# Patient Record
Sex: Female | Born: 1935 | Race: White | Hispanic: No | Smoking: Former smoker
Health system: Southern US, Community
[De-identification: ages and names within clinical notes are randomized; demographics above are authoritative.]

## PROBLEM LIST (undated history)

## (undated) DIAGNOSIS — G8929 Other chronic pain: Secondary | ICD-10-CM

## (undated) DIAGNOSIS — F419 Anxiety disorder, unspecified: Secondary | ICD-10-CM

## (undated) DIAGNOSIS — I7300 Raynaud's syndrome without gangrene: Secondary | ICD-10-CM

## (undated) DIAGNOSIS — F0390 Unspecified dementia without behavioral disturbance: Secondary | ICD-10-CM

## (undated) DIAGNOSIS — I10 Essential (primary) hypertension: Secondary | ICD-10-CM

## (undated) DIAGNOSIS — E039 Hypothyroidism, unspecified: Secondary | ICD-10-CM

## (undated) DIAGNOSIS — M79606 Pain in leg, unspecified: Secondary | ICD-10-CM

## (undated) HISTORY — PX: APPENDECTOMY: SHX54

## (undated) HISTORY — PX: ABDOMINAL HYSTERECTOMY: SHX81

## (undated) HISTORY — PX: OTHER SURGICAL HISTORY: SHX169

---

## 2004-12-27 ENCOUNTER — Ambulatory Visit: Payer: Self-pay | Admitting: Internal Medicine

## 2005-05-28 ENCOUNTER — Ambulatory Visit: Payer: Self-pay | Admitting: Internal Medicine

## 2005-12-13 ENCOUNTER — Ambulatory Visit: Payer: Self-pay | Admitting: Internal Medicine

## 2005-12-17 ENCOUNTER — Ambulatory Visit: Payer: Self-pay | Admitting: Internal Medicine

## 2006-01-02 ENCOUNTER — Ambulatory Visit: Payer: Self-pay | Admitting: Internal Medicine

## 2006-05-29 ENCOUNTER — Ambulatory Visit: Payer: Self-pay | Admitting: Unknown Physician Specialty

## 2006-08-28 ENCOUNTER — Ambulatory Visit: Payer: Self-pay | Admitting: Unknown Physician Specialty

## 2007-01-06 ENCOUNTER — Ambulatory Visit: Payer: Self-pay | Admitting: Internal Medicine

## 2007-01-09 ENCOUNTER — Ambulatory Visit: Payer: Self-pay | Admitting: Internal Medicine

## 2007-05-31 ENCOUNTER — Emergency Department: Payer: Self-pay | Admitting: Emergency Medicine

## 2008-01-07 ENCOUNTER — Ambulatory Visit: Payer: Self-pay | Admitting: Internal Medicine

## 2008-07-14 ENCOUNTER — Ambulatory Visit: Payer: Self-pay | Admitting: Ophthalmology

## 2008-07-25 ENCOUNTER — Ambulatory Visit: Payer: Self-pay | Admitting: Ophthalmology

## 2010-10-28 ENCOUNTER — Inpatient Hospital Stay: Payer: Self-pay | Admitting: Internal Medicine

## 2014-02-25 ENCOUNTER — Inpatient Hospital Stay: Payer: Self-pay | Admitting: Internal Medicine

## 2014-02-25 LAB — COMPREHENSIVE METABOLIC PANEL
Albumin: 3.3 g/dL — ABNORMAL LOW (ref 3.4–5.0)
Alkaline Phosphatase: 53 U/L
Anion Gap: 18 — ABNORMAL HIGH (ref 7–16)
BUN: 58 mg/dL — AB (ref 7–18)
Bilirubin,Total: 0.6 mg/dL (ref 0.2–1.0)
Calcium, Total: 7.7 mg/dL — ABNORMAL LOW (ref 8.5–10.1)
Chloride: 81 mmol/L — ABNORMAL LOW (ref 98–107)
Co2: 18 mmol/L — ABNORMAL LOW (ref 21–32)
Creatinine: 8.33 mg/dL — ABNORMAL HIGH (ref 0.60–1.30)
EGFR (African American): 6 — ABNORMAL LOW
EGFR (Non-African Amer.): 5 — ABNORMAL LOW
GLUCOSE: 121 mg/dL — AB (ref 65–99)
Osmolality: 254 (ref 275–301)
Potassium: 3.8 mmol/L (ref 3.5–5.1)
SGOT(AST): 28 U/L (ref 15–37)
SGPT (ALT): 22 U/L
Sodium: 117 mmol/L — CL (ref 136–145)
TOTAL PROTEIN: 6.8 g/dL (ref 6.4–8.2)

## 2014-02-25 LAB — URINALYSIS, COMPLETE
Bacteria: NONE SEEN
Bilirubin,UR: NEGATIVE
GLUCOSE, UR: NEGATIVE mg/dL (ref 0–75)
KETONE: NEGATIVE
Leukocyte Esterase: NEGATIVE
NITRITE: NEGATIVE
PH: 6 (ref 4.5–8.0)
Protein: NEGATIVE
Specific Gravity: 1.003 (ref 1.003–1.030)
Squamous Epithelial: NONE SEEN
WBC UR: 2 /HPF (ref 0–5)

## 2014-02-25 LAB — CBC WITH DIFFERENTIAL/PLATELET
Basophil #: 0 10*3/uL (ref 0.0–0.1)
Basophil %: 0.2 %
Eosinophil #: 0 10*3/uL (ref 0.0–0.7)
Eosinophil %: 0.1 %
HCT: 33.8 % — ABNORMAL LOW (ref 35.0–47.0)
HGB: 11.9 g/dL — ABNORMAL LOW (ref 12.0–16.0)
Lymphocyte #: 0.9 10*3/uL — ABNORMAL LOW (ref 1.0–3.6)
Lymphocyte %: 12.8 %
MCH: 32.1 pg (ref 26.0–34.0)
MCHC: 35.3 g/dL (ref 32.0–36.0)
MCV: 91 fL (ref 80–100)
Monocyte #: 0.7 x10 3/mm (ref 0.2–0.9)
Monocyte %: 9.4 %
Neutrophil #: 5.7 10*3/uL (ref 1.4–6.5)
Neutrophil %: 77.5 %
PLATELETS: 224 10*3/uL (ref 150–440)
RBC: 3.7 10*6/uL — ABNORMAL LOW (ref 3.80–5.20)
RDW: 11.9 % (ref 11.5–14.5)
WBC: 7.4 10*3/uL (ref 3.6–11.0)

## 2014-02-25 LAB — LIPASE, BLOOD: LIPASE: 712 U/L — AB (ref 73–393)

## 2014-02-25 LAB — CLOSTRIDIUM DIFFICILE(ARMC)

## 2014-02-25 LAB — TROPONIN I: Troponin-I: <0.02 ng/mL

## 2014-02-26 LAB — BASIC METABOLIC PANEL
Anion Gap: 16 (ref 7–16)
BUN: 49 mg/dL — ABNORMAL HIGH (ref 7–18)
CALCIUM: 7.2 mg/dL — AB (ref 8.5–10.1)
Chloride: 87 mmol/L — ABNORMAL LOW (ref 98–107)
Co2: 18 mmol/L — ABNORMAL LOW (ref 21–32)
Creatinine: 8.1 mg/dL — ABNORMAL HIGH (ref 0.60–1.30)
EGFR (Non-African Amer.): 5 — ABNORMAL LOW
GFR CALC AF AMER: 6 — AB
GLUCOSE: 79 mg/dL (ref 65–99)
OSMOLALITY: 256 (ref 275–301)
Potassium: 3.3 mmol/L — ABNORMAL LOW (ref 3.5–5.1)
SODIUM: 121 mmol/L — AB (ref 136–145)

## 2014-02-26 LAB — CBC WITH DIFFERENTIAL/PLATELET
BASOS ABS: 0 10*3/uL (ref 0.0–0.1)
Basophil %: 0.1 %
EOS PCT: 0.1 %
Eosinophil #: 0 10*3/uL (ref 0.0–0.7)
HCT: 30.8 % — ABNORMAL LOW (ref 35.0–47.0)
HGB: 11 g/dL — AB (ref 12.0–16.0)
LYMPHS PCT: 9.7 %
Lymphocyte #: 0.8 10*3/uL — ABNORMAL LOW (ref 1.0–3.6)
MCH: 32.8 pg (ref 26.0–34.0)
MCHC: 35.7 g/dL (ref 32.0–36.0)
MCV: 92 fL (ref 80–100)
Monocyte #: 1 x10 3/mm — ABNORMAL HIGH (ref 0.2–0.9)
Monocyte %: 12.1 %
NEUTROS ABS: 6.3 10*3/uL (ref 1.4–6.5)
Neutrophil %: 78 %
Platelet: 200 10*3/uL (ref 150–440)
RBC: 3.35 10*6/uL — AB (ref 3.80–5.20)
RDW: 12.1 % (ref 11.5–14.5)
WBC: 8.1 10*3/uL (ref 3.6–11.0)

## 2014-02-26 LAB — PROTEIN / CREATININE RATIO, URINE
CREATININE, URINE: 27.5 mg/dL — AB (ref 30.0–125.0)
PROTEIN, RANDOM URINE: 7 mg/dL (ref 0–12)
PROTEIN/CREAT. RATIO: 255 mg/g{creat} — AB (ref 0–200)

## 2014-02-27 LAB — CBC WITH DIFFERENTIAL/PLATELET
BASOS ABS: 0 10*3/uL (ref 0.0–0.1)
Basophil %: 0.2 %
EOS PCT: 0.7 %
Eosinophil #: 0 10*3/uL (ref 0.0–0.7)
HCT: 31.9 % — AB (ref 35.0–47.0)
HGB: 11.3 g/dL — ABNORMAL LOW (ref 12.0–16.0)
Lymphocyte #: 0.8 10*3/uL — ABNORMAL LOW (ref 1.0–3.6)
Lymphocyte %: 13.9 %
MCH: 32.4 pg (ref 26.0–34.0)
MCHC: 35.4 g/dL (ref 32.0–36.0)
MCV: 92 fL (ref 80–100)
Monocyte #: 1 x10 3/mm — ABNORMAL HIGH (ref 0.2–0.9)
Monocyte %: 16.5 %
Neutrophil #: 4.1 10*3/uL (ref 1.4–6.5)
Neutrophil %: 68.7 %
Platelet: 203 10*3/uL (ref 150–440)
RBC: 3.49 10*6/uL — AB (ref 3.80–5.20)
RDW: 12.4 % (ref 11.5–14.5)
WBC: 6 10*3/uL (ref 3.6–11.0)

## 2014-02-27 LAB — COMPREHENSIVE METABOLIC PANEL
Albumin: 2.8 g/dL — ABNORMAL LOW (ref 3.4–5.0)
Alkaline Phosphatase: 47 U/L
Anion Gap: 16 (ref 7–16)
BILIRUBIN TOTAL: 0.4 mg/dL (ref 0.2–1.0)
BUN: 49 mg/dL — ABNORMAL HIGH (ref 7–18)
CREATININE: 7.69 mg/dL — AB (ref 0.60–1.30)
Calcium, Total: 7.1 mg/dL — ABNORMAL LOW (ref 8.5–10.1)
Chloride: 94 mmol/L — ABNORMAL LOW (ref 98–107)
Co2: 16 mmol/L — ABNORMAL LOW (ref 21–32)
EGFR (African American): 6 — ABNORMAL LOW
GFR CALC NON AF AMER: 5 — AB
GLUCOSE: 86 mg/dL (ref 65–99)
Osmolality: 266 (ref 275–301)
Potassium: 3.3 mmol/L — ABNORMAL LOW (ref 3.5–5.1)
SGOT(AST): 26 U/L (ref 15–37)
SGPT (ALT): 21 U/L
Sodium: 126 mmol/L — ABNORMAL LOW (ref 136–145)
Total Protein: 6 g/dL — ABNORMAL LOW (ref 6.4–8.2)

## 2014-02-28 LAB — COMPREHENSIVE METABOLIC PANEL
ALBUMIN: 2.7 g/dL — AB (ref 3.4–5.0)
Alkaline Phosphatase: 51 U/L
Anion Gap: 15 (ref 7–16)
BUN: 48 mg/dL — ABNORMAL HIGH (ref 7–18)
Bilirubin,Total: 0.3 mg/dL (ref 0.2–1.0)
CO2: 24 mmol/L (ref 21–32)
CREATININE: 7.08 mg/dL — AB (ref 0.60–1.30)
Calcium, Total: 7.1 mg/dL — ABNORMAL LOW (ref 8.5–10.1)
Chloride: 90 mmol/L — ABNORMAL LOW (ref 98–107)
EGFR (African American): 7 — ABNORMAL LOW
EGFR (Non-African Amer.): 6 — ABNORMAL LOW
GLUCOSE: 113 mg/dL — AB (ref 65–99)
Osmolality: 272 (ref 275–301)
Potassium: 2.9 mmol/L — ABNORMAL LOW (ref 3.5–5.1)
SGOT(AST): 24 U/L (ref 15–37)
SGPT (ALT): 21 U/L
Sodium: 129 mmol/L — ABNORMAL LOW (ref 136–145)
TOTAL PROTEIN: 5.9 g/dL — AB (ref 6.4–8.2)

## 2014-02-28 LAB — KAPPA/LAMBDA FREE LIGHT CHAINS (ARMC)

## 2014-02-28 LAB — PROTEIN ELECTROPHORESIS(ARMC)

## 2014-02-28 LAB — LIPASE, BLOOD: Lipase: 387 U/L (ref 73–393)

## 2014-03-01 LAB — CBC WITH DIFFERENTIAL/PLATELET
BASOS ABS: 0 10*3/uL (ref 0.0–0.1)
BASOS PCT: 0.4 %
EOS ABS: 0.1 10*3/uL (ref 0.0–0.7)
EOS PCT: 1.3 %
HCT: 29.4 % — AB (ref 35.0–47.0)
HGB: 10.3 g/dL — AB (ref 12.0–16.0)
Lymphocyte #: 1.1 10*3/uL (ref 1.0–3.6)
Lymphocyte %: 20.4 %
MCH: 32.4 pg (ref 26.0–34.0)
MCHC: 35.2 g/dL (ref 32.0–36.0)
MCV: 92 fL (ref 80–100)
Monocyte #: 1.1 x10 3/mm — ABNORMAL HIGH (ref 0.2–0.9)
Monocyte %: 19.6 %
NEUTROS ABS: 3.2 10*3/uL (ref 1.4–6.5)
Neutrophil %: 58.3 %
Platelet: 224 10*3/uL (ref 150–440)
RBC: 3.19 10*6/uL — AB (ref 3.80–5.20)
RDW: 12.2 % (ref 11.5–14.5)
WBC: 5.4 10*3/uL (ref 3.6–11.0)

## 2014-03-01 LAB — COMPREHENSIVE METABOLIC PANEL
ANION GAP: 10 (ref 7–16)
AST: 30 U/L (ref 15–37)
Albumin: 2.8 g/dL — ABNORMAL LOW (ref 3.4–5.0)
Alkaline Phosphatase: 51 U/L
BUN: 44 mg/dL — ABNORMAL HIGH (ref 7–18)
Bilirubin,Total: 0.3 mg/dL (ref 0.2–1.0)
CALCIUM: 7.3 mg/dL — AB (ref 8.5–10.1)
Chloride: 93 mmol/L — ABNORMAL LOW (ref 98–107)
Co2: 31 mmol/L (ref 21–32)
Creatinine: 6.52 mg/dL — ABNORMAL HIGH (ref 0.60–1.30)
EGFR (Non-African Amer.): 6 — ABNORMAL LOW
GFR CALC AF AMER: 8 — AB
GLUCOSE: 109 mg/dL — AB (ref 65–99)
Osmolality: 280 (ref 275–301)
POTASSIUM: 3.7 mmol/L (ref 3.5–5.1)
SGPT (ALT): 25 U/L
Sodium: 134 mmol/L — ABNORMAL LOW (ref 136–145)
TOTAL PROTEIN: 6 g/dL — AB (ref 6.4–8.2)

## 2014-03-01 LAB — STOOL CULTURE

## 2014-03-01 LAB — MAGNESIUM: Magnesium: 1.2 mg/dL — ABNORMAL LOW

## 2014-03-02 LAB — BASIC METABOLIC PANEL
Anion Gap: 9 (ref 7–16)
BUN: 43 mg/dL — ABNORMAL HIGH (ref 7–18)
CALCIUM: 7.1 mg/dL — AB (ref 8.5–10.1)
CREATININE: 5.44 mg/dL — AB (ref 0.60–1.30)
Chloride: 98 mmol/L (ref 98–107)
Co2: 28 mmol/L (ref 21–32)
EGFR (African American): 10 — ABNORMAL LOW
EGFR (Non-African Amer.): 8 — ABNORMAL LOW
GLUCOSE: 92 mg/dL (ref 65–99)
Osmolality: 281 (ref 275–301)
Potassium: 4.1 mmol/L (ref 3.5–5.1)
Sodium: 135 mmol/L — ABNORMAL LOW (ref 136–145)

## 2014-03-02 LAB — CBC WITH DIFFERENTIAL/PLATELET
BASOS ABS: 0 10*3/uL (ref 0.0–0.1)
BASOS PCT: 0.4 %
Eosinophil #: 0.1 10*3/uL (ref 0.0–0.7)
Eosinophil %: 1.7 %
HCT: 27.4 % — ABNORMAL LOW (ref 35.0–47.0)
HGB: 9.3 g/dL — AB (ref 12.0–16.0)
Lymphocyte #: 1.1 10*3/uL (ref 1.0–3.6)
Lymphocyte %: 19.8 %
MCH: 32 pg (ref 26.0–34.0)
MCHC: 34.1 g/dL (ref 32.0–36.0)
MCV: 94 fL (ref 80–100)
MONOS PCT: 17.1 %
Monocyte #: 0.9 x10 3/mm (ref 0.2–0.9)
NEUTROS ABS: 3.3 10*3/uL (ref 1.4–6.5)
Neutrophil %: 61 %
PLATELETS: 209 10*3/uL (ref 150–440)
RBC: 2.92 10*6/uL — AB (ref 3.80–5.20)
RDW: 12.6 % (ref 11.5–14.5)
WBC: 5.3 10*3/uL (ref 3.6–11.0)

## 2014-03-02 LAB — UR PROT ELECTROPHORESIS, URINE RANDOM

## 2014-03-03 LAB — CULTURE, BLOOD (SINGLE)

## 2014-03-03 LAB — STOOL CULTURE

## 2014-03-04 ENCOUNTER — Ambulatory Visit: Payer: Self-pay | Admitting: Nephrology

## 2014-03-04 LAB — RENAL FUNCTION PANEL
ANION GAP: 10 (ref 7–16)
Albumin: 3.6 g/dL (ref 3.4–5.0)
BUN: 42 mg/dL — AB (ref 7–18)
CHLORIDE: 94 mmol/L — AB (ref 98–107)
CREATININE: 4 mg/dL — AB (ref 0.60–1.30)
Calcium, Total: 8.6 mg/dL (ref 8.5–10.1)
Co2: 27 mmol/L (ref 21–32)
EGFR (African American): 14 — ABNORMAL LOW
EGFR (Non-African Amer.): 11 — ABNORMAL LOW
Glucose: 132 mg/dL — ABNORMAL HIGH (ref 65–99)
Osmolality: 275 (ref 275–301)
POTASSIUM: 4.3 mmol/L (ref 3.5–5.1)
Phosphorus: 4.8 mg/dL (ref 2.5–4.9)
Sodium: 131 mmol/L — ABNORMAL LOW (ref 136–145)

## 2014-03-07 ENCOUNTER — Ambulatory Visit: Payer: Self-pay | Admitting: Nephrology

## 2014-03-07 LAB — RENAL FUNCTION PANEL
Albumin: 3.7 g/dL (ref 3.4–5.0)
Anion Gap: 7 (ref 7–16)
BUN: 44 mg/dL — ABNORMAL HIGH (ref 7–18)
CHLORIDE: 96 mmol/L — AB (ref 98–107)
CO2: 29 mmol/L (ref 21–32)
CREATININE: 2.78 mg/dL — AB (ref 0.60–1.30)
Calcium, Total: 8.8 mg/dL (ref 8.5–10.1)
EGFR (African American): 21 — ABNORMAL LOW
EGFR (Non-African Amer.): 17 — ABNORMAL LOW
Glucose: 151 mg/dL — ABNORMAL HIGH (ref 65–99)
OSMOLALITY: 279 (ref 275–301)
POTASSIUM: 3.3 mmol/L — AB (ref 3.5–5.1)
Phosphorus: 3.8 mg/dL (ref 2.5–4.9)
Sodium: 132 mmol/L — ABNORMAL LOW (ref 136–145)

## 2014-08-13 NOTE — Consult Note (Signed)
Pt seen and examined. Full consult to follow. At least 3 week hx of acute diarrhea, perhaps starting day after eating fast foods. Admitted with dehydration, acute renal failure, and metabolic acidosis despite trying antibiotics at home. Overall slightly better though still feels weak. Stool watery yesterday. Stool starting to form today. Stool studies are negative. Pt had colonoscopy attempted in 2008. Had medium sized polyp removed. Had significant sigmoid diverticular disease. Unfortunatley, scope could not reach beyond mid sigmoid colon. Barium enema was recommended to evatluate rest of colon then but never scheduled according to patient. Normallly, with hx of colon polyp, persistent diarrhea, and incomplete colonoscopy in the past, I would normally recommend repeating colonoscopy. However, with her prior hx, colonscopy may not be feasible at this time. Barium enema later could be considered, but patient will still have to drink bowel prep, which patient is unwilling to do. Even if BE shows something abnormal, then dilemma is what to do about abnormal finding. At this time, recommend continuing IV hydration and, supportive care, and Abx coverage. Has been on clears. Will advance to full liquid diet. Will follow. Thanks   Electronic Signatures: Lutricia Feilh, Jamil Armwood (MD) (Signed on 08-Nov-15 10:44)  Authored   Last Updated: 08-Nov-15 10:45 by Lutricia Feilh, Deborh Pense (MD)

## 2014-08-13 NOTE — Consult Note (Signed)
PATIENT NAME:  Mercedes Powell, Mercedes Powell MR#:  [id number] DATE OF BIRTH:  1928/12/25  DATE OF CONSULTATION:  02/27/2014  REFERRING PHYSICIAN:   CONSULTING PHYSICIAN:  Ezzard Standing. Zeus Marquis, MD  REASON FOR REFERRAL: Diarrhea   DESCRIPTION: The patient is an 81 year old white female who was admitted with at least 3 weeks of diarrhea that started after eating some fast food. Initially, she was treated for possible diverticulitis as an outpatient with Cipro and Flagyl for about a week. Unfortunately, her symptoms got worse with diarrhea and generalized weakness associated with nausea, vomiting, and decreased appetite, and it was to the point that she could not even control bowel movements. Therefore, she was admitted for further evaluation. The patient was found to be in acute renal failure with metabolic acidosis and significant dehydration.   Overall, in the last couple of days, she is starting to feel a little better. She still feels weak. Yesterday, she had watery stools. Today, the stools are starting to form.   PAST MEDICAL HISTORY:  Notable for hypothyroidism, panic attacks, hyponatremia, peripheral vascular disease, history of breast nodules and hypertension.   PAST SURGICAL HISTORY INCLUDES: Hysterectomy, appendectomy, and cataract surgery.   ALLERGIES: SHE IS ALLERGIC TO CODEINE AND ACE INHIBITORS.   SOCIAL HISTORY: She lives by herself. She used to smoke for 60 years, but quit. There is no alcohol history.   FAMILY HISTORY: Notable for coronary artery disease, stroke and dementia.   HOME MEDICATIONS: Vitamins, levothyroxine daily, losartan 50 mg twice a day, metoprolol 50 mg twice a day, amlodipine 10 mg daily. She has already finished the Cipro and Flagyl for a week.   REVIEW OF SYSTEMS: She feels weak. There are no fevers or chills. There are no visual or hearing changes. No coughing or shortness of breath. There is no chest pain or palpitations. There are no skin lesions. There is some abdominal pain  associated with nausea, vomiting, and diarrhea, which has been present for at least 3 weeks.   PHYSICAL EXAMINATION:  GENERAL: The patient is an elderly female in no acute distress, but she does look weak.  VITAL SIGNS: Stable.  HEENT: Normocephalic, atraumatic head. Pupils were equally reactive. Throat was clear.  NECK: Supple.  CARDIAC: Regular rhythm and rate.  LUNGS: Clear bilaterally.  ABDOMEN: Normoactive bowel sounds. Soft. It as nontender today and nondistended. She has good active bowel sounds.  EXTREMITIES: No clubbing, cyanosis, or edema.  SKIN: Some decrease in skin turgor.  RADIOLOGICAL DATA: On admission her sodium was only 117; creatinine was 8.33, BUN 58. Liver enzymes were normal. Lipase was 712. White count normal at 7.4. Hemoglobin was 11.9. This morning, creatinine is slightly better at 7.69; sodium is better at 126, potassium 3.3. Liver enzymes are again normal. Hemoglobin is stable at 11.0. Stool tests have been negative.   The patient had colonoscopy attempted in 2008; at that time, a medium-sized polyp was removed. The patient has significant sigmoid diverticula. Dr. Mechele Collin, who had performed the colonoscopy, could not advance the scope beyond the mid sigmoid colon. Barium enema was recommended shortly after, but it was never scheduled according to the patient.   The patient has had at least a 3 week history of diarrhea. So far, the stool studies have been negative. Clinically, she is slowly improving. Normally, with a history of colon polyp and incomplete colonoscopy in the past, I would recommend doing a colonoscopy now. Unfortunately, it be may be very difficult to get the scope around the sigmoid again. The  patient is not going to drink the bowel prep to go for the colonoscopy at this time. Barium enema later could be considered, but the patient will still have to drink the bowel prep. Even if the barium enema shows something abnormal, then the dilemma is exactly what to  do about it with that abnormal finding. Therefore, at this time, I recommend continuing with IV hydration and supportive care. Hopefully, her condition will continue to improve. The patient has been on a clear liquid diet. I will try to advance the diet to a full liquid diet and see what happens with the diarrhea.   Thank you for the referral.    ____________________________ Ezzard StandingPaul Y. Bluford Kaufmannh, MD pyo:MT D: 02/28/2014 08:59:28 ET T: 02/28/2014 09:24:04 ET JOB#: [id number]  cc: Ezzard StandingPaul Y. Bluford Kaufmannh, MD, <Dictator> Ezzard StandingPAUL Y Quashon Jesus MD ELECTRONICALLY SIGNED 03/03/2014 10:05

## 2014-08-13 NOTE — Discharge Summary (Signed)
PATIENT NAME:  Mercedes Powell, Mercedes Powell MR#:  [id number] DATE OF BIRTH:  12-29-1928  DATE OF ADMISSION:  02/25/2014 DATE OF DISCHARGE:  03/02/2014  DISCHARGE DIAGNOSES:  1.  Acute renal failure due to acute tubular necrosis from hypotension.  2.  Hyponatremia.  3.  Hypokalemia.  4.  Memory disturbance.   DISCHARGE MEDICATIONS:  Ascorbic acid 500 mg daily, Vactiv calcium chews 2 b.i.d., vitamin D 2000 units daily, Synthroid 75 mcg daily, Super B complex daily, metoprolol tartrate 50 mg 0.5 tablet every morning, Aricept 5 mg at bedtime.   REASON FOR ADMISSION: An 81 year old female presents with acute renal failure and weakness with hyponatremia and hypokalemia. Please see H and P for history of present illness, past medical history and physical exam.   HOSPITAL COURSE: The patient was admitted. CT of the abdomen showed some mild inflammation, otherwise negative. All of her stool cultures were negative. She was given a sodium bicarbonate drip with IV fluid resuscitation and her renal function improved slowly. Her renal ultrasound was negative. Her creatinine started at 8 was down to 5.4 on discharge with baseline creatinine 1.5. Her blood pressure medicine was discontinued and only 1/2 tablet  metoprolol tartrate given in the morning to ward off any rebound tachycardia. She was somewhat unstable on her feet and received physical therapy and will be getting home health physical therapy. Diarrhea has resolved. There is no sign of infectious illness at this point. She will follow up with Dr. Hyacinth MeekerMiller in 1 week to re-evaluate her renal function and she is instructed to drink 2 liters of fluid daily.  Aricept was started for her memory as it has been declining of recent.    ____________________________ Danella PentonMark F. Miller, MD mfm:lt D: 03/02/2014 08:12:21 ET T: 03/02/2014 11:15:21 ET JOB#: [id number]  cc: Danella PentonMark F. Miller, MD, <Dictator> MARK Sherlene ShamsF MILLER MD ELECTRONICALLY SIGNED 03/03/2014 8:32

## 2014-08-13 NOTE — Consult Note (Signed)
Chief Complaint:  Subjective/Chief Complaint Diarrhea resolved. K low. Being repleted. Still feels weak.   VITAL SIGNS/ANCILLARY NOTES: **Vital Signs.:   09-Nov-15 04:30  Vital Signs Type Routine  Temperature Temperature (F) 98  Celsius 36.6  Temperature Source oral  Pulse Pulse 70  Respirations Respirations 20  Systolic BP Systolic BP 356  Diastolic BP (mmHg) Diastolic BP (mmHg) 69  Mean BP 86  Pulse Ox % Pulse Ox % 95  Pulse Ox Activity Level  At rest  Oxygen Delivery Room Air/ 21 %   Brief Assessment:  GEN no acute distress   Cardiac Regular   Respiratory clear BS   Gastrointestinal Normal   Lab Results: Hepatic:  09-Nov-15 06:12   Bilirubin, Total 0.3  Alkaline Phosphatase 51 (46-116 NOTE: New Reference Range 11/09/13)  SGPT (ALT) 21 (14-63 NOTE: New Reference Range 11/09/13)  SGOT (AST) 24  Total Protein, Serum  5.9  Albumin, Serum  2.7  Routine Chem:  09-Nov-15 06:12   Glucose, Serum  113  BUN  48  Creatinine (comp)  7.08  Sodium, Serum  129  Potassium, Serum  2.9  Chloride, Serum  90  CO2, Serum 24  Calcium (Total), Serum  7.1  Osmolality (calc) 272  eGFR (African American)  7  eGFR (Non-African American)  6 (eGFR values <18m/min/1.73 m2 may be an indication of chronic kidney disease (CKD). Calculated eGFR, using the MRDR Study equation, is useful in  patients with stable renal function. The eGFR calculation will not be reliable in acutely ill patients when serum creatinine is changing rapidly. It is not useful in patients on dialysis. The eGFR calculation may not be applicable to patients at the low and high extremes of body sizes, pregnant women, and vegetarians.)  Anion Gap 15  Lipase 387 (Result(s) reported on 28 Feb 2014 at 06:56AM.)   Assessment/Plan:  Assessment/Plan:  Assessment Diarrhea. Resolved. ARF and hypokalemia.   Plan Will sign off. Call [facility] if diarrehea recurs. See yesterday's notes. Thanks.   Electronic  Signatures: OVerdie Shire(MD)  (Signed [phone]:[id number])  Authored: Chief Complaint, VITAL SIGNS/ANCILLARY NOTES, Brief Assessment, Lab Results, Assessment/Plan   Last Updated: 09-Nov-15 13:12 by OVerdie Shire(MD)

## 2014-08-13 NOTE — H&P (Signed)
PATIENT NAME:  Mercedes Powell, Mercedes Powell MR#:  [id number] DATE OF BIRTH:  Sep 18, 1928  DATE OF ADMISSION:  02/25/2014  PRIMARY DOCTOR: Bethann PunchesMark Miller, MD     CHIEF COMPLAINT: Diarrhea for 3 weeks.   HISTORY OF PRESENT ILLNESS: An 81 year old female with diarrhea for 3 weeks, treated for diverticulitis as an outpatient with Cipro and Flagyl for a week. The patient still has diarrhea with generalized weakness, poor appetite, nausea and vomiting. The patient started to have diarrhea 3 weeks ago associated with abdominal cramps. Went to Dr. Marlynn PerkingMark Miller's office, was given Cipro and Flagyl since 10/26. The patient finished the antibiotics but still has diarrhea, which sometimes she is unable to control the BM, associated with some nausea and poor appetite. She says she was on a liquid diet for about 2 weeks. Denies any fever. No other complaints. Feels very weak.   PAST MEDICAL HISTORY: Significant for hypertension, hypothyroidism, anxiety, panic attacks, chronic leg pain, hyponatremia, peripheral vascular disease, history of breast nodules.   PAST SURGICAL HISTORY: Significant for hysterectomy, appendectomy and cataract operation.  ALLERGIES: CODEINE CAUSES HER TONGUE SWELLING, ACE INHIBITORS CAUSE ANGIOEDEMA.   SOCIAL HISTORY: Lives by herself. The patient smoked for 60 years and quit. No alcohol.   FAMILY HISTORY: Significant for coronary artery disease in the father. Mother had dementia and CVA.   MEDICATIONS: Ascorbic acid 500 mg p.o. daily, vitamin B complex 1 tablet p.o. daily, calcium with vitamin D 500/1000 units/40 mcg p.o. b.i.d., levothyroxine 75 mcg p.o. daily, losartan 50 mg p.o. b.i.d., Metoprolol tartrate 50 mg p.o. b.i.d. She finished Cipro and Flagyl. She is also on amlodipine 10 mg p.o. daily.   REVIEW OF SYSTEMS:  CONSTITUTIONAL: The patient feels very weak. Denies any fever or fatigue.  EYES: No blurred vision.  ENT: No tinnitus. No ear pain. No epistaxis. No difficulty swallowing.   RESPIRATION: No cough. No wheezing.  CARDIOVASCULAR: No chest pain. No orthopnea. No palpitations. No syncope.  GENITOURINARY: No dysuria or hematuria.  ENDOCRINE: No polyuria or dysuria.  ENDOCRINE: No history of diabetes.  GENITOURINARY: He denies any trouble urinating and she says she is able to urinate normal.  MUSCULOSKELETAL: Denies joint pains.  NEUROLOGIC: Generalized weakness but no history of stroke.  GASTROINTESTINAL: Slight abdominal pain, mainly in the epigastric area, not radiating to her back, associated nausea, vomiting on and off and diarrhea which is her main complaint. She has had diarrhea for 3 weeks. The patient says that she is having around 3 to 4 loose stools and sometimes she has incontinence as well. She is on liquid diet for about 2 weeks.   PHYSICAL EXAMINATION: VITAL SIGNS: Temperature 98.3, heart rate is 58, blood pressure 131/51, oxygen saturating 99% on room air.  GENERAL: The patient is alert, awake, oriented. An 81 year old female not in distress.  HEAD: Normocephalic, atraumatic.  EYES: Pupils equal, reacting to light. No conjunctival pallor.  NOSE: No nasal lesions. No drainage.  EARS: No drainage or external lesions.  MOUTH: No lesions. No exudates.  NECK: Supple. No JVD. No carotid bruits. Normal range of motion.  RESPIRATORY: Clear to auscultation. No wheeze noticed.  CARDIOVASCULAR: S1, S2 regular. No murmurs.  GASTROINTESTINAL: Abdomen is soft, nontender, nondistended. Bowel sounds present.  MUSCULOSKELETAL: Able to move extremities x 4.  SKIN: Inspection is normal. Decreased skin turgor is observed. Mucous membranes are very dry.  NEUROLOGIC: Cranial nerves 2 through 12 intact. Power 5/5 in upper and lower extremities. Sensation intact. DTR 2+ bilaterally.  PSYCHIATRIC: Mood and  affect are within normal.   LABORATORY DATA: WBC 7.4, hemoglobin 9.9, hematocrit 33.8, platelets 224,000. Electrolytes: Sodium is 117, potassium 3.8, chloride is 81, BUN  58, creatinine 8.3, bicarbonate is 18. The patient's LFTs are within normal limits. ABG with pH 7.4 and OO2 is 82, PCO2 is 26 and bicarbonate 16.5.   DIAGNOSTIC DATA: CT abdoem showed onspecific small volume of free fluid layering dependently within the bright pelvis. While no definite source for the free fluid is identified, this is considered abnormal in a postmenopausal patient. And occult (by imaging) infectious or inflammatory process involving the bowel is a consideration. 2. Sigmoid colonic diverticular disease without evidence of active inflammation. 3. Trace right pleural effusion. 4. Extensive atherosclerotic vascular calcifications. 5. Circumferential thickening of the distal esophagus. Recommend clinical correlation for signs and symptoms of reflux. Other infectious, inflammatory or less likely neoplastic etiologies are not excluded. The patient's EKG shows sinus bradycardia with T wave inversions in V4, V5 and V6 at 59 beats per minute.   ASSESSMENT AND PLAN: The patient is an 81 year old female with nausea, vomiting and diarrhea, which has been going on 3 weeks with decreased p.o. intake for the last 3 weeks, comes in with generalized weakness and found to have acute renal failure. The patient's creatinine in 2012 was 1.34.  1. The patient has acute renal failure, likely prerenal with decreased p.o. intake with diarrhea. The patient admitted to the hospitalist service and continue IV fluids with bicarbonate at 80 mL an hour. Obtain nephrology consult. Monitor the urine output. Monitor daily kidney function. We ordered an ultrasound, please follow that to evaluate for any obstruction.  2. Hyponatremia. The patient's sodium is around 130. Based on previous records from 2012, she had hyponatremia, which was severe, due to dehydration and decreased p.o. intake. Hold the losartan. Continue IV fluids and monitor Chem-7 every 4 hours.  3. Hypothyroidism. Continue levothyroxine 75 mcg p.o.  daily.  4. Hypertension. Blood pressure is stable and she is bradycardic so hold the metoprolol at this time.  5. Diarrhea. Check stool for Clostridium difficile, stool cultures as well.  6. The patient is admitted to the medical service on telemetry.   TIME SPENT: 55 minutes    ____________________________ Katha Hamming, MD sk:TT D: 02/25/2014 19:19:31 ET T: 02/25/2014 20:04:00 ET JOB#: [id number]  cc: Katha Hamming, MD, <Dictator> Katha Hamming MD ELECTRONICALLY SIGNED 04/04/2014 7:19

## 2015-02-27 ENCOUNTER — Encounter: Payer: Self-pay | Admitting: Emergency Medicine

## 2015-02-27 ENCOUNTER — Emergency Department: Payer: Medicare Other

## 2015-02-27 ENCOUNTER — Emergency Department
Admission: EM | Admit: 2015-02-27 | Discharge: 2015-02-27 | Disposition: A | Discharge status: Home / Self Care | Payer: Medicare Other | Attending: Student | Admitting: Student

## 2015-02-27 DIAGNOSIS — Z79899 Other long term (current) drug therapy: Secondary | ICD-10-CM | POA: Diagnosis not present

## 2015-02-27 DIAGNOSIS — Z87891 Personal history of nicotine dependence: Secondary | ICD-10-CM | POA: Diagnosis not present

## 2015-02-27 DIAGNOSIS — R42 Dizziness and giddiness: Secondary | ICD-10-CM | POA: Insufficient documentation

## 2015-02-27 DIAGNOSIS — I10 Essential (primary) hypertension: Secondary | ICD-10-CM | POA: Insufficient documentation

## 2015-02-27 DIAGNOSIS — R55 Syncope and collapse: Secondary | ICD-10-CM | POA: Diagnosis present

## 2015-02-27 DIAGNOSIS — M6281 Muscle weakness (generalized): Secondary | ICD-10-CM | POA: Insufficient documentation

## 2015-02-27 HISTORY — DX: Essential (primary) hypertension: I10

## 2015-02-27 LAB — CBC WITH DIFFERENTIAL/PLATELET
BASOS ABS: 0.1 10*3/uL (ref 0–0.1)
BASOS PCT: 1 %
EOS ABS: 0 10*3/uL (ref 0–0.7)
Eosinophils Relative: 1 %
HCT: 39.1 % (ref 35.0–47.0)
HEMOGLOBIN: 13 g/dL (ref 12.0–16.0)
Lymphocytes Relative: 21 %
Lymphs Abs: 1.1 10*3/uL (ref 1.0–3.6)
MCH: 30.5 pg (ref 26.0–34.0)
MCHC: 33.3 g/dL (ref 32.0–36.0)
MCV: 91.6 fL (ref 80.0–100.0)
MONOS PCT: 10 %
Monocytes Absolute: 0.5 10*3/uL (ref 0.2–0.9)
NEUTROS PCT: 67 %
Neutro Abs: 3.5 10*3/uL (ref 1.4–6.5)
Platelets: 213 10*3/uL (ref 150–440)
RBC: 4.27 MIL/uL (ref 3.80–5.20)
RDW: 12.9 % (ref 11.5–14.5)
WBC: 5.2 10*3/uL (ref 3.6–11.0)

## 2015-02-27 LAB — COMPREHENSIVE METABOLIC PANEL
ALBUMIN: 4 g/dL (ref 3.5–5.0)
ALK PHOS: 64 U/L (ref 38–126)
ALT: 15 U/L (ref 14–54)
ANION GAP: 10 (ref 5–15)
AST: 23 U/L (ref 15–41)
BUN: 22 mg/dL — ABNORMAL HIGH (ref 6–20)
CALCIUM: 10 mg/dL (ref 8.9–10.3)
CO2: 33 mmol/L — AB (ref 22–32)
Chloride: 92 mmol/L — ABNORMAL LOW (ref 101–111)
Creatinine, Ser: 1.27 mg/dL — ABNORMAL HIGH (ref 0.44–1.00)
GFR calc Af Amer: 44 mL/min — ABNORMAL LOW (ref >60)
GFR calc non Af Amer: 38 mL/min — ABNORMAL LOW (ref >60)
GLUCOSE: 118 mg/dL — AB (ref 65–99)
Potassium: 3.1 mmol/L — ABNORMAL LOW (ref 3.5–5.1)
SODIUM: 135 mmol/L (ref 135–145)
Total Bilirubin: 0.4 mg/dL (ref 0.3–1.2)
Total Protein: 7.6 g/dL (ref 6.5–8.1)

## 2015-02-27 LAB — TROPONIN I: Troponin I: <0.03 ng/mL (ref <0.031)

## 2015-02-27 MED ORDER — AMLODIPINE BESYLATE 5 MG PO TABS
5.0000 mg | ORAL_TABLET | Freq: Once | ORAL | Status: AC
  Administered 2015-02-27: 5 mg via ORAL
  Filled 2015-02-27: qty 1

## 2015-02-27 MED ORDER — AMLODIPINE BESYLATE 5 MG PO TABS: 5.0000 mg | ORAL_TABLET | Freq: Once | ORAL | Status: DC

## 2015-02-27 NOTE — Discharge Instructions (Signed)
To [facility] staff: The patient was unable to produce a urine sample today. Please collect a urine sample over the next 2 days and send it with the patient to Dr. Rondel BatonMiller's office when she follows up with him in clinic. He should be contacting you with appointment details for the patient however if you do not hear from him by tomorrow afternoon, call the number provided. This patient should return immediatley to the emergency Department for severe or worsening symptoms, worsening dizziness, falls, severe headache, vision change, numbness or weakness, increased confusion, vomiting, chest pain, difficulty breathing, fevers,difficulties, or for any other concerns.

## 2015-02-27 NOTE — ED Notes (Signed)
Ems from [facility]? for syncope. Per ems pt has had an issue with hypertension and her meds were being adjusted. Pt stood up and passed out

## 2015-02-27 NOTE — ED Provider Notes (Signed)
[facility] Emergency Department Provider Note  ____________________________________________  Time seen: Approximately 2:59 PM  I have reviewed the triage vital signs and the nursing notes.   HISTORY  Chief Complaint Near Syncope  Caveat-history of present illness and review of systems Limited secondary to the patient's chronic memory impairment. Information is obtained from staff at [facility] and family at bedside.  HPI Mercedes Powell is a 81 y.o. female with history of hypertension, hypothyroidism, panic disorder, memory impairment who presents for evaluation of dizziness and generalized weakness, gradual onset today, now resolved. According to staff at [facility], the patient has had intermittent dizziness as well as hypertension over the past week. She was started on 12.5 mg daily HCTZ by Dr. Hyacinth Meeker, her primary care doctor, on 02/21/2015 for this. Today she was complaining of generalized weakness and dizziness and staff checked her blood pressure which was quite elevated. It was still elevated on recheck and her family requested that she be sent to the emergency department. She did not faint at any time today, no fall, no head injury. Family reports that she has otherwise been in usual state of health without illness, no cough, sneezing, runny nose, congestion, vomiting, diarrhea, fevers or chills she denies any chest pain or difficulty breathing. There are no modifying factors. The patient has no complaints at this time.   Past Medical History  Diagnosis Date  . Hypertension     There are no active problems to display for this patient.   History reviewed. No pertinent past surgical history.  Current Outpatient Rx  Name  Route  Sig  Dispense  Refill  . Calcium 500 MG CHEW   Oral   Chew 500 mg by mouth daily.         . Cholecalciferol (VITAMIN D) 2000 UNITS CAPS   Oral   Take 2,000 Units by mouth daily.         . diphenoxylate-atropine (LOMOTIL)  2.5-0.025 MG tablet   Oral   Take 1 tablet by mouth 3 (three) times daily as needed for diarrhea or loose stools.         . hydrochlorothiazide (HYDRODIURIL) 12.5 MG tablet   Oral   Take 12.5 mg by mouth daily.         Marland Kitchen levothyroxine (SYNTHROID, LEVOTHROID) 75 MCG tablet   Oral   Take 75 mcg by mouth daily before breakfast.         . Multiple Vitamin (MULTIVITAMIN WITH MINERALS) TABS tablet   Oral   Take 1 tablet by mouth daily.         Marland Kitchen PARoxetine (PAXIL) 10 MG tablet   Oral   Take 10 mg by mouth daily.         Bertram Gala Glycol-Propyl Glycol (SYSTANE) 0.4-0.3 % GEL ophthalmic gel   Both Eyes   Place 1 application into both eyes as needed (for dry eyes).         . polyvinyl alcohol (LIQUIFILM TEARS) 1.4 % ophthalmic solution   Both Eyes   Place 1 drop into both eyes 4 (four) times daily.           Allergies Ace inhibitors; Galantamine; Maxidex; Maxzide; Procardia; and Zoloft  History reviewed. No pertinent family history.  Social History Social History  Substance Use Topics  . Smoking status: Former Games developer  . Smokeless tobacco: None  . Alcohol Use: No    Review of Systems Constitutional: No fever/chills Eyes: No visual changes. ENT: No sore  throat. Cardiovascular: Denies chest pain. Respiratory: Denies shortness of breath. Gastrointestinal: No abdominal pain.  No nausea, no vomiting.  No diarrhea.  No constipation. Genitourinary: Negative for dysuria. Musculoskeletal: Negative for back pain. Skin: Negative for rash. Neurological: Negative for headaches, focal weakness or numbness.   Caveat-history of present illness and review of systems Limited secondary to the patient's chronic memory impairment. Information is obtained from staff at [facility] and family at bedside. ____________________________________________   PHYSICAL EXAM:  VITAL SIGNS: ED Triage Vitals  Enc Vitals Group     BP 02/27/15 1454 220/73 mmHg     Pulse Rate 02/27/15  1454 63     Resp --      Temp 02/27/15 1454 98.1 F (36.7 C)     Temp Source 02/27/15 1454 Oral     SpO2 02/27/15 1454 98 %     Weight 02/27/15 1454 101 lb (45.813 kg)     Height 02/27/15 1454 5\' 2"  (1.575 m)     Head Cir --      Peak Flow --      Pain Score --      Pain Loc --      Pain Edu? --      Excl. in GC? --     Constitutional: Alert and oriented x 3. Well appearing and in no acute distress. Eyes: Conjunctivae are normal. PERRL. EOMI. Head: Atraumatic. Nose: No congestion/rhinnorhea. Mouth/Throat: Mucous membranes are moist.  Oropharynx non-erythematous. Neck: No stridor.   Cardiovascular: Normal rate, regular rhythm. Grossly normal heart sounds.  Good peripheral circulation. Respiratory: Normal respiratory effort.  No retractions. Lungs CTAB. Gastrointestinal: Soft and nontender. No distention. No abdominal bruits. No CVA tenderness. Genitourinary: deferred Musculoskeletal: No lower extremity tenderness nor edema.  No joint effusions. Neurologic:  Normal speech and language. No gross focal neurologic deficits are appreciated. 5 out of 5 strength in bilateral upper and lower extremities, sensation intact to light touch throughout. Skin:  Skin is warm, dry and intact. No rash noted. Psychiatric: Mood and affect are normal. Speech and behavior are normal.  ____________________________________________   LABS (all labs ordered are listed, but only abnormal results are displayed)  Labs Reviewed  COMPREHENSIVE METABOLIC PANEL - Abnormal; Notable for the following:    Potassium 3.1 (*)    Chloride 92 (*)    CO2 33 (*)    Glucose, Bld 118 (*)    BUN 22 (*)    Creatinine, Ser 1.27 (*)    GFR calc non Af Amer 38 (*)    GFR calc Af Amer 44 (*)    All other components within normal limits  CBC WITH DIFFERENTIAL/PLATELET  TROPONIN I  URINALYSIS COMPLETEWITH MICROSCOPIC ([facility] ONLY)   ____________________________________________  EKG  ED ECG REPORT I, Gayla DossGayle, Salle Brandle A,  the attending physician, personally viewed and interpreted this ECG.   Date: 02/27/2015  EKG Time: 16:40  Rate: 61  Rhythm: normal sinus rhythm  Axis: left axis  Intervals:none  ST&T Change: No acute ST elevation. T-wave inversions in V4, V5, V6. EKG unchanged from 02/25/2014.  ____________________________________________  RADIOLOGY  CXR IMPRESSION: No active cardiopulmonary disease.    CT head IMPRESSION: No acute intracranial abnormality.  Cerebral atrophy.  Extensive white matter disease likely representing chronic small vessel ischemic disease. ____________________________________________   PROCEDURES  Procedure(s) performed: None  Critical Care performed: No  ____________________________________________   INITIAL IMPRESSION / ASSESSMENT AND PLAN / ED COURSE  Pertinent labs & imaging results that were available during my care  of the patient were reviewed by me and considered in my medical decision making (see chart for details).  Mercedes Powell is a 81 y.o. female with history of hypertension, hypothyroidism, panic disorder, memory impairment who presents for evaluation of dizziness and generalized weakness, gradual onset today, now resolved. On exam, she is very well-appearing and in no acute distress. She is hypertensive with blood pressure of 220/73 however the remainder of her vital signs are stable, she is afebrile. She has benign examination, intact neurological examination, no acute complaints. Plan for screening labs, EKG, CT head and chest x-ray as well as urinalysis. I discussed the case with Dr. Bethann Punches, her primary care doctor who recommended addition of amlodipine 5 mg by mouth daily we will give. He will await expedient follow-up for the patient. Reassess for disposition.  ----------------------------------------- 6:14 PM on 02/27/2015 -----------------------------------------  The patient is resting comfortably, she reports that she feels well.  Blood pressure has improved to 190/67 and currently she is asymptomatic. CBC unremarkable. CMP is notable for mild creatinine elevation at 1.27 however this appears significantly improved from prior. Mild hypokalemia. Negative troponin. Chest x-ray shows no acute cardiopulmonary disease. CT head shows no acute intracranial abnormality. We have attempted to obtain a urine sample to rule out urinary tract infection however the patient has contaminated it several times. I discussed with her family at bedside and they do not want [place] to proceed with in and out catheterization. Instead we'll discharge back to [facility] with instructions for them to obtain urine sample and she will follow-up with Dr. Hyacinth Meeker in the next 2-3 days. Family at bedside are comfortable with this plan. DC with return precautions and close PCP follow-up. ____________________________________________   FINAL CLINICAL IMPRESSION(S) / ED DIAGNOSES  Final diagnoses:  Dizziness  Essential hypertension      Gayla Doss, MD 02/27/15 1816

## 2015-04-10 ENCOUNTER — Encounter: Payer: Self-pay | Admitting: *Deleted

## 2015-04-10 ENCOUNTER — Emergency Department: Payer: Medicare Other

## 2015-04-10 ENCOUNTER — Inpatient Hospital Stay
Admission: EM | Admit: 2015-04-10 | Discharge: 2015-04-13 | DRG: 470 | Disposition: A | Discharge status: Skilled Nursing Facility | Payer: Medicare Other | Attending: Specialist | Admitting: Specialist

## 2015-04-10 DIAGNOSIS — I129 Hypertensive chronic kidney disease with stage 1 through stage 4 chronic kidney disease, or unspecified chronic kidney disease: Secondary | ICD-10-CM | POA: Diagnosis present

## 2015-04-10 DIAGNOSIS — S72001A Fracture of unspecified part of neck of right femur, initial encounter for closed fracture: Principal | ICD-10-CM | POA: Diagnosis present

## 2015-04-10 DIAGNOSIS — W1830XA Fall on same level, unspecified, initial encounter: Secondary | ICD-10-CM | POA: Diagnosis present

## 2015-04-10 DIAGNOSIS — S72009A Fracture of unspecified part of neck of unspecified femur, initial encounter for closed fracture: Secondary | ICD-10-CM | POA: Diagnosis present

## 2015-04-10 DIAGNOSIS — E039 Hypothyroidism, unspecified: Secondary | ICD-10-CM | POA: Diagnosis present

## 2015-04-10 DIAGNOSIS — F0390 Unspecified dementia without behavioral disturbance: Secondary | ICD-10-CM | POA: Diagnosis present

## 2015-04-10 DIAGNOSIS — Y92009 Unspecified place in unspecified non-institutional (private) residence as the place of occurrence of the external cause: Secondary | ICD-10-CM

## 2015-04-10 DIAGNOSIS — E876 Hypokalemia: Secondary | ICD-10-CM | POA: Diagnosis present

## 2015-04-10 DIAGNOSIS — Z87891 Personal history of nicotine dependence: Secondary | ICD-10-CM

## 2015-04-10 DIAGNOSIS — Z9889 Other specified postprocedural states: Secondary | ICD-10-CM

## 2015-04-10 DIAGNOSIS — E871 Hypo-osmolality and hyponatremia: Secondary | ICD-10-CM | POA: Diagnosis present

## 2015-04-10 DIAGNOSIS — N189 Chronic kidney disease, unspecified: Secondary | ICD-10-CM | POA: Diagnosis present

## 2015-04-10 DIAGNOSIS — I7300 Raynaud's syndrome without gangrene: Secondary | ICD-10-CM | POA: Diagnosis present

## 2015-04-10 DIAGNOSIS — Z888 Allergy status to other drugs, medicaments and biological substances status: Secondary | ICD-10-CM

## 2015-04-10 DIAGNOSIS — Z8781 Personal history of (healed) traumatic fracture: Secondary | ICD-10-CM

## 2015-04-10 DIAGNOSIS — F419 Anxiety disorder, unspecified: Secondary | ICD-10-CM | POA: Diagnosis present

## 2015-04-10 HISTORY — DX: Anxiety disorder, unspecified: F41.9

## 2015-04-10 HISTORY — DX: Hypothyroidism, unspecified: E03.9

## 2015-04-10 HISTORY — DX: Unspecified dementia, unspecified severity, without behavioral disturbance, psychotic disturbance, mood disturbance, and anxiety: F03.90

## 2015-04-10 HISTORY — DX: Essential (primary) hypertension: I10

## 2015-04-10 HISTORY — DX: Raynaud's syndrome without gangrene: I73.00

## 2015-04-10 HISTORY — DX: Pain in leg, unspecified: M79.606

## 2015-04-10 HISTORY — DX: Other chronic pain: G89.29

## 2015-04-10 LAB — URINALYSIS COMPLETE WITH MICROSCOPIC (ARMC ONLY)
BACTERIA UA: NONE SEEN
Bilirubin Urine: NEGATIVE
GLUCOSE, UA: NEGATIVE mg/dL
HGB URINE DIPSTICK: NEGATIVE
Leukocytes, UA: NEGATIVE
NITRITE: NEGATIVE
PH: 7 (ref 5.0–8.0)
PROTEIN: NEGATIVE mg/dL
Specific Gravity, Urine: 1.009 (ref 1.005–1.030)

## 2015-04-10 LAB — TYPE AND SCREEN
ABO/RH(D): O POS
ANTIBODY SCREEN: NEGATIVE

## 2015-04-10 LAB — CBC WITH DIFFERENTIAL/PLATELET
BASOS PCT: 0 %
Basophils Absolute: 0 10*3/uL (ref 0–0.1)
EOS ABS: 0 10*3/uL (ref 0–0.7)
EOS PCT: 0 %
HCT: 38.3 % (ref 35.0–47.0)
Hemoglobin: 12.9 g/dL (ref 12.0–16.0)
LYMPHS ABS: 1.1 10*3/uL (ref 1.0–3.6)
Lymphocytes Relative: 15 %
MCH: 30.5 pg (ref 26.0–34.0)
MCHC: 33.8 g/dL (ref 32.0–36.0)
MCV: 90.4 fL (ref 80.0–100.0)
MONO ABS: 0.7 10*3/uL (ref 0.2–0.9)
MONOS PCT: 9 %
NEUTROS PCT: 76 %
Neutro Abs: 5.9 10*3/uL (ref 1.4–6.5)
Platelets: 259 10*3/uL (ref 150–440)
RBC: 4.24 MIL/uL (ref 3.80–5.20)
RDW: 12.4 % (ref 11.5–14.5)
WBC: 7.8 10*3/uL (ref 3.6–11.0)

## 2015-04-10 LAB — BASIC METABOLIC PANEL
Anion gap: 10 (ref 5–15)
BUN: 18 mg/dL (ref 6–20)
CO2: 32 mmol/L (ref 22–32)
CREATININE: 1.22 mg/dL — AB (ref 0.44–1.00)
Calcium: 10 mg/dL (ref 8.9–10.3)
Chloride: 89 mmol/L — ABNORMAL LOW (ref 101–111)
GFR calc non Af Amer: 40 mL/min — ABNORMAL LOW (ref >60)
GFR, EST AFRICAN AMERICAN: 46 mL/min — AB (ref >60)
Glucose, Bld: 134 mg/dL — ABNORMAL HIGH (ref 65–99)
Potassium: 3.3 mmol/L — ABNORMAL LOW (ref 3.5–5.1)
SODIUM: 131 mmol/L — AB (ref 135–145)

## 2015-04-10 LAB — APTT: aPTT: 31 seconds (ref 24–36)

## 2015-04-10 LAB — ABO/RH: ABO/RH(D): O POS

## 2015-04-10 LAB — PROTIME-INR
INR: 0.95
PROTHROMBIN TIME: 12.9 s (ref 11.4–15.0)

## 2015-04-10 LAB — MRSA PCR SCREENING: MRSA BY PCR: NEGATIVE

## 2015-04-10 LAB — TROPONIN I

## 2015-04-10 MED ORDER — ONDANSETRON HCL 4 MG PO TABS: 4.0000 mg | ORAL_TABLET | Freq: Four times a day (QID) | ORAL | Status: DC | PRN

## 2015-04-10 MED ORDER — ONDANSETRON HCL 4 MG/2ML IJ SOLN
4.0000 mg | Freq: Once | INTRAMUSCULAR | Status: AC
  Administered 2015-04-10: 4 mg via INTRAVENOUS
  Filled 2015-04-10: qty 2

## 2015-04-10 MED ORDER — LEVOTHYROXINE SODIUM 75 MCG PO TABS
75.0000 ug | ORAL_TABLET | Freq: Every day | ORAL | Status: DC
  Administered 2015-04-12 – 2015-04-13 (×2): 75 ug via ORAL
  Filled 2015-04-10 (×2): qty 1

## 2015-04-10 MED ORDER — ACETAMINOPHEN 650 MG RE SUPP: 650.0000 mg | Freq: Four times a day (QID) | RECTAL | Status: DC | PRN

## 2015-04-10 MED ORDER — LUBRIFRESH P.M. OP OINT
1.0000 "application " | TOPICAL_OINTMENT | OPHTHALMIC | Status: DC | PRN
  Administered 2015-04-10: 1 via OPHTHALMIC
  Filled 2015-04-10 (×2): qty 1

## 2015-04-10 MED ORDER — ENOXAPARIN SODIUM 30 MG/0.3ML ~~LOC~~ SOLN: 30.0000 mg | SUBCUTANEOUS | Status: DC

## 2015-04-10 MED ORDER — POTASSIUM CHLORIDE 20 MEQ PO PACK
40.0000 meq | PACK | Freq: Once | ORAL | Status: AC
  Administered 2015-04-10: 40 meq via ORAL
  Filled 2015-04-10: qty 2

## 2015-04-10 MED ORDER — ONDANSETRON HCL 4 MG/2ML IJ SOLN: 4.0000 mg | Freq: Four times a day (QID) | INTRAMUSCULAR | Status: DC | PRN

## 2015-04-10 MED ORDER — POLYVINYL ALCOHOL 1.4 % OP SOLN
1.0000 [drp] | Freq: Four times a day (QID) | OPHTHALMIC | Status: DC
  Administered 2015-04-10 (×2): 1 [drp] via OPHTHALMIC
  Filled 2015-04-10: qty 15

## 2015-04-10 MED ORDER — DIPHENOXYLATE-ATROPINE 2.5-0.025 MG PO TABS
1.0000 | ORAL_TABLET | Freq: Three times a day (TID) | ORAL | Status: DC | PRN
Start: 2015-04-10 — End: 2015-04-13

## 2015-04-10 MED ORDER — VITAMIN D 1000 UNITS PO TABS
2000.0000 [IU] | ORAL_TABLET | Freq: Every day | ORAL | Status: DC
  Administered 2015-04-12 – 2015-04-13 (×2): 2000 [IU] via ORAL
  Filled 2015-04-10 (×2): qty 2

## 2015-04-10 MED ORDER — CALCIUM CARBONATE ANTACID 500 MG PO CHEW
500.0000 mg | CHEWABLE_TABLET | Freq: Every day | ORAL | Status: DC
  Administered 2015-04-12 – 2015-04-13 (×2): 500 mg via ORAL
  Filled 2015-04-10 (×2): qty 1

## 2015-04-10 MED ORDER — PAROXETINE HCL 10 MG PO TABS
10.0000 mg | ORAL_TABLET | Freq: Every day | ORAL | Status: DC
  Administered 2015-04-12 – 2015-04-13 (×2): 10 mg via ORAL
  Filled 2015-04-10 (×2): qty 1

## 2015-04-10 MED ORDER — ACETAMINOPHEN 325 MG PO TABS: 650.0000 mg | ORAL_TABLET | Freq: Four times a day (QID) | ORAL | Status: DC | PRN

## 2015-04-10 MED ORDER — SODIUM CHLORIDE 0.9 % IV SOLN
INTRAVENOUS | Status: DC
  Administered 2015-04-10 – 2015-04-11 (×3): via INTRAVENOUS

## 2015-04-10 MED ORDER — HYDROCODONE-ACETAMINOPHEN 5-325 MG PO TABS
1.0000 | ORAL_TABLET | ORAL | Status: DC | PRN
  Administered 2015-04-11: 1 via ORAL
  Filled 2015-04-10: qty 1

## 2015-04-10 MED ORDER — AMLODIPINE BESYLATE 5 MG PO TABS
5.0000 mg | ORAL_TABLET | Freq: Once | ORAL | Status: DC
Start: 2015-04-10 — End: 2015-04-13
  Filled 2015-04-10: qty 1

## 2015-04-10 MED ORDER — ADULT MULTIVITAMIN W/MINERALS CH: 1.0000 | ORAL_TABLET | Freq: Every day | ORAL | Status: DC

## 2015-04-10 MED ORDER — MORPHINE SULFATE (PF) 2 MG/ML IV SOLN
2.0000 mg | Freq: Once | INTRAVENOUS | Status: AC
  Administered 2015-04-10: 2 mg via INTRAVENOUS
  Filled 2015-04-10: qty 1

## 2015-04-10 MED ORDER — MORPHINE SULFATE (PF) 2 MG/ML IV SOLN: 1.0000 mg | INTRAVENOUS | Status: DC | PRN

## 2015-04-10 NOTE — Consult Note (Signed)
ORTHOPAEDIC CONSULTATION  REQUESTING PHYSICIAN: Auburn BilberryShreyang Patel, MD  Chief Complaint: Right hip pain status post fall  HPI: Mercedes Powell is a 81 y.o. female who complains of  right hip pain after a fall earlier today.  Patient's son is in the room today and explains that it appears the patient fell while trying to get up with her walker. Patient states she is not having significant right hip pain as long as she does not move her right hip. She denies other injuries.  Past Medical History  Diagnosis Date  . Hypertension   . Anxiety   . Chronic leg pain   . HTN (hypertension)   . Hypothyroid   . Raynaud phenomenon   . Dementia    Past Surgical History  Procedure Laterality Date  . Appendectomy    . Abdominal hysterectomy    . Breast lump removal     Social History   Social History  . Marital Status: Married    Spouse Name: N/A  . Number of Children: N/A  . Years of Education: N/A   Social History Main Topics  . Smoking status: Former Games developermoker  . Smokeless tobacco: None  . Alcohol Use: No  . Drug Use: No  . Sexual Activity: Not Asked   Other Topics Concern  . None   Social History Narrative   Family History  Problem Relation Age of Onset  . Hypertension     Allergies  Allergen Reactions  . Ace Inhibitors Swelling  . Galantamine Diarrhea  . Maxidex [Dexamethasone] Other (See Comments)    Reaction:  Unknown   . Maxzide [Hydrochlorothiazide W-Triamterene] Other (See Comments)    Reaction:  Hyponatremia   . Procardia [Nifedipine] Swelling  . Zoloft [Sertraline Hcl] Diarrhea   Prior to Admission medications   Medication Sig Start Date End Date Taking? Authorizing Provider  amLODipine (NORVASC) 5 MG tablet Take 1 tablet (5 mg total) by mouth once. 02/27/15  Yes Gayla DossEryka A Gayle, MD  Calcium 500 MG CHEW Chew 500 mg by mouth daily.   Yes Historical Provider, MD  Cholecalciferol (VITAMIN D) 2000 UNITS CAPS Take 2,000 Units by mouth daily.   Yes Historical Provider, MD   diphenoxylate-atropine (LOMOTIL) 2.5-0.025 MG tablet Take 1 tablet by mouth 3 (three) times daily as needed for diarrhea or loose stools.   Yes Historical Provider, MD  hydrochlorothiazide (HYDRODIURIL) 12.5 MG tablet Take 12.5 mg by mouth daily.   Yes Historical Provider, MD  levothyroxine (SYNTHROID, LEVOTHROID) 75 MCG tablet Take 75 mcg by mouth daily before breakfast.   Yes Historical Provider, MD  Multiple Vitamin (MULTIVITAMIN WITH MINERALS) TABS tablet Take 1 tablet by mouth daily.   Yes Historical Provider, MD  PARoxetine (PAXIL) 10 MG tablet Take 10 mg by mouth daily.   Yes Historical Provider, MD  Polyethyl Glycol-Propyl Glycol (SYSTANE) 0.4-0.3 % GEL ophthalmic gel Place 1 application into both eyes as needed (for dry eyes).   Yes Historical Provider, MD  polyvinyl alcohol (LIQUIFILM TEARS) 1.4 % ophthalmic solution Place 1 drop into both eyes 4 (four) times daily.   Yes Historical Provider, MD   Dg Chest 1 View  04/10/2015  CLINICAL DATA:  Fall, hypertension, right hip pain EXAM: CHEST 1 VIEW COMPARISON:  02/27/2015 FINDINGS: Cardiomediastinal silhouette is stable. No acute infiltrate or pleural effusion. No pulmonary edema. Bony thorax is unremarkable. IMPRESSION: No active disease. Electronically Signed   By: Natasha MeadLiviu  Pop M.D.   On: 04/10/2015 14:29   Dg Hip Unilat  With  Pelvis 2-3 Views Right  04/10/2015  CLINICAL DATA:  Fall, right hip pain EXAM: DG HIP (WITH OR WITHOUT PELVIS) 2-3V RIGHT COMPARISON:  None. FINDINGS: Three views of the right hip submitted. Diffuse osteopenia. There is displaced fracture of the right femoral neck. Degenerative changes pubic symphysis. IMPRESSION: Diffuse osteopenia.  Displaced fracture of the right femoral neck. Electronically Signed   By: Natasha Mead M.D.   On: 04/10/2015 14:26    Positive ROS: All other systems have been reviewed and were otherwise negative with the exception of those mentioned in the HPI and as above.  Physical Exam: General:  Alert, no acute distress  MUSCULOSKELETAL: Right hip: Patient's skin is intact. There is no erythema or ecchymosis or swelling. Her thigh compartments are soft and compressible as are her lower leg compartments. Her right lower extremity is shortened and externally rotated. She has palpable pedal pulses. She has intact sensation light touch and intact motor function.  Assessment: Right femoral neck hip fracture  Plan: I reviewed with the patient and her family that she has a femoral neck hip fracture. This is displaced by x-ray. I'm recommending a right hip hemiarthroplasty as treatment. I explained the details of the operation to the patient and her family. They understand the patient will be in the hospital for a minimum of 2-3 days. She will receive physical therapy while in the hospital. She will require skilled nursing facility upon discharge.  The risks benefits and alternatives were discussed with the patient and their family including but not limited to the risks of  infection requiring removal of the prosthesis, bleeding requiring blood transfusion, nerve injury especially to the sciatic nerve leading to foot drop or lower extremity numbness, periprosthetic fracture, dislocation leg length discrepancy, change in lower extremity rotation persistent hip pain, loosening or failure of the components and the need for revision surgery. Medical risks include but are not limited to DVT and pulmonary wasn't, myocardial infarction, stroke, pneumonia, respiratory failure and death.  Patient been cleared by the medical service for surgery. She is deemed to moderate risk for surgical intervention. No further workup has been ordered.  I reviewed the patient's radiographic studies as well as the labs in preparation for this case.  I answered all questions by the patient and her family. I placed the case on the OR posting line for tomorrow. She is nothing by mouth after midnight. She should not receive any  anticoagulation overnight preparation for her surgery.   Juanell Fairly, MD    04/10/2015 5:58 PM

## 2015-04-10 NOTE — Progress Notes (Signed)
ANTICOAGULATION CONSULT NOTE - Initial Consult  Pharmacy Consult for Lovenox  Indication: VTE prophylaxis  Allergies  Allergen Reactions  . Ace Inhibitors Swelling  . Galantamine Diarrhea  . Maxidex [Dexamethasone] Other (See Comments)    Reaction:  Unknown   . Maxzide [Hydrochlorothiazide W-Triamterene] Other (See Comments)    Reaction:  Hyponatremia   . Procardia [Nifedipine] Swelling  . Zoloft [Sertraline Hcl] Diarrhea    Patient Measurements: Height: 5\' 2"  (157.5 cm) Weight: 105 lb (47.628 kg) IBW/kg (Calculated) : 50.1 Heparin Dosing Weight:   Vital Signs: Temp: 98.5 F (36.9 C) (12/19 1723) Temp Source: Oral (12/19 1723) BP: 144/64 mmHg (12/19 1723) Pulse Rate: 84 (12/19 1723)  Labs:  Recent Labs  04/10/15 1332  HGB 12.9  HCT 38.3  PLT 259  APTT 31  LABPROT 12.9  INR 0.95  CREATININE 1.22*  TROPONINI <0.03    Estimated Creatinine Clearance: 25.8 mL/min (by C-G formula based on Cr of 1.22).   Medical History: Past Medical History  Diagnosis Date  . Hypertension   . Anxiety   . Chronic leg pain   . HTN (hypertension)   . Hypothyroid   . Raynaud phenomenon   . Dementia     Medications:  Scheduled:  . amLODipine  5 mg Oral Once  . calcium carbonate  500 mg Oral Daily  . cholecalciferol  2,000 Units Oral Daily  . enoxaparin (LOVENOX) injection  30 mg Subcutaneous Q24H  . [START ON 04/11/2015] levothyroxine  75 mcg Oral QAC breakfast  . multivitamin with minerals  1 tablet Oral Daily  . PARoxetine  10 mg Oral Daily  . polyvinyl alcohol  1 drop Both Eyes QID    Assessment: CrCl = 25.8 ml/min  Goal of Therapy:  DVT prophylaxis    Plan:  Lovenox 40 mg SQ Q24H originally ordered.  Will adjust dose to lovenox 30 mg SQ Q24H based on CrCl < 30 ml/min.   Mercedes Powell 04/10/2015,5:39 PM

## 2015-04-10 NOTE — H&P (Signed)
[facility] [place] [facility]   PATIENT NAME: Mercedes LentoBetty Zeigler    MR#:  [id number]  DATE OF BIRTH:  [birth year]  DATE OF ADMISSION:  04/10/2015  PRIMARY CARE PHYSICIAN: Bethann PunchesMark Miller MD  REQUESTING/REFERRING PHYSICIAN: DR Lyla SonSchavitz Matthew  CHIEF COMPLAINT:   Chief Complaint  Patient presents with  . Hip Pain    HISTORY OF PRESENT ILLNESS: Mercedes Powell  is a 81 y.o. female with a known history of  hypertension, anxiety, hypothyroidism, and dementia who has had recurrent falls. She currently resides in assisted living. And she sustained a fall earlier today. He started complaining of pain in her legs brought to the ED for evaluation and noted to have a hip fracture. The ER physician has spoken to the orthopedic doctor who recommends admission to the medical service. Patient reports that she has been feeling okay denies any chest pain or shortness of breath no urinary symptoms. PAST MEDICAL HISTORY:   Past Medical History  Diagnosis Date  . Hypertension   . Anxiety   . Chronic leg pain   . HTN (hypertension)   . Hypothyroid   . Raynaud phenomenon     PAST SURGICAL HISTORY:  Past Surgical History  Procedure Laterality Date  . Appendectomy    . Abdominal hysterectomy    . Breast lump removal      SOCIAL HISTORY:  Social History  Substance Use Topics  . Smoking status: Former Games developermoker  . Smokeless tobacco: Not on file  . Alcohol Use: No    FAMILY HISTORY:  Family History  Problem Relation Age of Onset  . Hypertension      DRUG ALLERGIES:  Allergies  Allergen Reactions  . Ace Inhibitors Swelling  . Galantamine Diarrhea  . Maxidex [Dexamethasone] Other (See Comments)    Reaction:  Unknown   . Maxzide [Hydrochlorothiazide W-Triamterene] Other (See Comments)    Reaction:  Hyponatremia   . Procardia [Nifedipine] Swelling  . Zoloft [Sertraline Hcl] Diarrhea    REVIEW OF SYSTEMS:   CONSTITUTIONAL: No fever, fatigue or weakness.  EYES: No  blurred or double vision.  EARS, NOSE, AND THROAT: No tinnitus or ear pain.  RESPIRATORY: No cough, shortness of breath, wheezing or hemoptysis.  CARDIOVASCULAR: No chest pain, orthopnea, edema.  GASTROINTESTINAL: No nausea, vomiting, diarrhea or abdominal pain.  GENITOURINARY: No dysuria, hematuria.  ENDOCRINE: No polyuria, nocturia,  HEMATOLOGY: No anemia, easy bruising or bleeding SKIN: No rash or lesion. MUSCULOSKELETAL: No joint pain or arthritis.  Recurrent falls NEUROLOGIC: No tingling, numbness, weakness.  PSYCHIATRY: Chronic anxiety , no depression.   MEDICATIONS AT HOME:  Prior to Admission medications   Medication Sig Start Date End Date Taking? Authorizing Provider  amLODipine (NORVASC) 5 MG tablet Take 1 tablet (5 mg total) by mouth once. 02/27/15  Yes Gayla DossEryka A Gayle, MD  Calcium 500 MG CHEW Chew 500 mg by mouth daily.   Yes Historical Provider, MD  Cholecalciferol (VITAMIN D) 2000 UNITS CAPS Take 2,000 Units by mouth daily.   Yes Historical Provider, MD  diphenoxylate-atropine (LOMOTIL) 2.5-0.025 MG tablet Take 1 tablet by mouth 3 (three) times daily as needed for diarrhea or loose stools.   Yes Historical Provider, MD  hydrochlorothiazide (HYDRODIURIL) 12.5 MG tablet Take 12.5 mg by mouth daily.   Yes Historical Provider, MD  levothyroxine (SYNTHROID, LEVOTHROID) 75 MCG tablet Take 75 mcg by mouth daily before breakfast.   Yes Historical Provider, MD  Multiple Vitamin (MULTIVITAMIN WITH MINERALS) TABS tablet Take 1 tablet by  mouth daily.   Yes Historical Provider, MD  PARoxetine (PAXIL) 10 MG tablet Take 10 mg by mouth daily.   Yes Historical Provider, MD  Polyethyl Glycol-Propyl Glycol (SYSTANE) 0.4-0.3 % GEL ophthalmic gel Place 1 application into both eyes as needed (for dry eyes).   Yes Historical Provider, MD  polyvinyl alcohol (LIQUIFILM TEARS) 1.4 % ophthalmic solution Place 1 drop into both eyes 4 (four) times daily.   Yes Historical Provider, MD      PHYSICAL  EXAMINATION:   VITAL SIGNS: Blood pressure 148/72, pulse 94, temperature 98.2 F (36.8 C), temperature source Oral, resp. rate 16, height  (1.575 m), weight 47.628 kg (105 lb), SpO2 98 %.  GENERAL:  81 y.o.-year-old patient lying in the bed with no acute distress.  EYES: Pupils equal, round, reactive to light and accommodation. No scleral icterus. Extraocular muscles intact.  HEENT: Head atraumatic, normocephalic. Oropharynx and nasopharynx clear.  NECK:  Supple, no jugular venous distention. No thyroid enlargement, no tenderness.  LUNGS: Normal breath sounds bilaterally, no wheezing, rales,rhonchi or crepitation. No use of accessory muscles of respiration.  CARDIOVASCULAR: S1, S2 normal. No murmurs, rubs, or gallops.  ABDOMEN: Soft, nontender, nondistended. Bowel sounds present. No organomegaly or mass.  EXTREMITIES: No pedal edema, cyanosis, or clubbing.  NEUROLOGIC: Cranial nerves II through XII are intact. Muscle strength 5/5 in all extremities. Sensation intact. Gait not checked.  PSYCHIATRIC: The patient is alert and oriented x 3.  SKIN: No obvious rash, lesion, or ulcer.   LABORATORY PANEL:   CBC  Recent Labs Lab 04/10/15 1332  WBC 7.8  HGB 12.9  HCT 38.3  PLT 259  MCV 90.4  MCH 30.5  MCHC 33.8  RDW 12.4  LYMPHSABS 1.1  MONOABS 0.7  EOSABS 0.0  BASOSABS 0.0   ------------------------------------------------------------------------------------------------------------------  Chemistries   Recent Labs Lab 04/10/15 1332  NA 131*  K 3.3*  CL 89*  CO2 32  GLUCOSE 134*  BUN 18  CREATININE 1.22*  CALCIUM 10.0   ------------------------------------------------------------------------------------------------------------------ estimated creatinine clearance is 25.8 mL/min (by C-G formula based on Cr of 1.22). ------------------------------------------------------------------------------------------------------------------ No results for input(s): TSH, T4TOTAL,  T3FREE, THYROIDAB in the last 72 hours.  Invalid input(s): FREET3   Coagulation profile No results for input(s): INR, PROTIME in the last 168 hours. ------------------------------------------------------------------------------------------------------------------- No results for input(s): DDIMER in the last 72 hours. -------------------------------------------------------------------------------------------------------------------  Cardiac Enzymes  Recent Labs Lab 04/10/15 1332  TROPONINI <0.03   ------------------------------------------------------------------------------------------------------------------ Invalid input(s): POCBNP  ---------------------------------------------------------------------------------------------------------------  Urinalysis    Component Value Date/Time   COLORURINE Straw 02/25/2014 1630   APPEARANCEUR Clear 02/25/2014 1630   LABSPEC 1.003 02/25/2014 1630   PHURINE 6.0 02/25/2014 1630   GLUCOSEU Negative 02/25/2014 1630   HGBUR 1+ 02/25/2014 1630   BILIRUBINUR Negative 02/25/2014 1630   KETONESUR Negative 02/25/2014 1630   PROTEINUR Negative 02/25/2014 1630   NITRITE Negative 02/25/2014 1630   LEUKOCYTESUR Negative 02/25/2014 1630     RADIOLOGY: Dg Chest 1 View  04/10/2015  CLINICAL DATA:  Fall, hypertension, right hip pain EXAM: CHEST 1 VIEW COMPARISON:  02/27/2015 FINDINGS: Cardiomediastinal silhouette is stable. No acute infiltrate or pleural effusion. No pulmonary edema. Bony thorax is unremarkable. IMPRESSION: No active disease. Electronically Signed   By: Natasha Mead M.D.   On: 04/10/2015 14:29   Dg Hip Unilat  With Pelvis 2-3 Views Right  04/10/2015  CLINICAL DATA:  Fall, right hip pain EXAM: DG HIP (WITH OR WITHOUT PELVIS) 2-3V RIGHT COMPARISON:  None. FINDINGS: Three views of the  right hip submitted. Diffuse osteopenia. There is displaced fracture of the right femoral neck. Degenerative changes pubic symphysis. IMPRESSION: Diffuse  osteopenia.  Displaced fracture of the right femoral neck. Electronically Signed   By: Natasha Mead M.D.   On: 04/10/2015 14:26    EKG: Orders placed or performed during the hospital encounter of 04/10/15  . EKG 12-Lead  . EKG 12-Lead    IMPRESSION AND PLAN: Patient is a 81 year old white female with history hypertension, anxiety, hypothyroidism being admitted with hip fracture  1. Hip fracture: Patient is at moderate risk of surgical complications. Currently has no cardiopulmonary symptoms and does not need any further workup okay to proceed to the operating room.  2. Hypertension continue low-dose amlodipine I'll hold HCTZ for now  3. Chronic renal failure monitor renal function  4. Hypothyroidism continue Synthroid  5. Anxiety continue Paxil  6. Miscellaneous Lovenox postop    All the records are reviewed and case discussed with ED provider. Management plans discussed with the patient, family and they are in agreement.  CODE STATUS:    TOTAL TIME TAKING CARE OF THIS PATIENT: 55 minutes.    Auburn Bilberry M.D on 04/10/2015 at 3:41 PM  Between 7am to 6pm - Pager - [phone]  After 6pm go to [email or web address] - password [email or web address] [facility]  [place]  Office  [phone]  CC: Primary care physician; No primary care provider on file.

## 2015-04-10 NOTE — ED Provider Notes (Signed)
[facility] Note  ____________________________________________  Time seen: Approximately 110 PM  I have reviewed the triage vital signs and the nursing notes.   HISTORY  Chief Complaint Hip Pain    HPI Mercedes Powell is a 81 y.o. female with a history of hypertension who is presenting today after a fall. The patient does not remember the details of the fall. Is unclear if this was a chemical or caused by some other instigating factor. However, at this time she says that she has pain in her right hip and proximal femur. She denies any pain to her head.   Past Medical History  Diagnosis Date  . Hypertension     There are no active problems to display for this patient.   History reviewed. No pertinent past surgical history.  Current Outpatient Rx  Name  Route  Sig  Dispense  Refill  . amLODipine (NORVASC) 5 MG tablet   Oral   Take 1 tablet (5 mg total) by mouth once.   15 tablet   0   . Calcium 500 MG CHEW   Oral   Chew 500 mg by mouth daily.         . Cholecalciferol (VITAMIN D) 2000 UNITS CAPS   Oral   Take 2,000 Units by mouth daily.         . diphenoxylate-atropine (LOMOTIL) 2.5-0.025 MG tablet   Oral   Take 1 tablet by mouth 3 (three) times daily as needed for diarrhea or loose stools.         . hydrochlorothiazide (HYDRODIURIL) 12.5 MG tablet   Oral   Take 12.5 mg by mouth daily.         Marland Kitchen. levothyroxine (SYNTHROID, LEVOTHROID) 75 MCG tablet   Oral   Take 75 mcg by mouth daily before breakfast.         . Multiple Vitamin (MULTIVITAMIN WITH MINERALS) TABS tablet   Oral   Take 1 tablet by mouth daily.         Marland Kitchen. PARoxetine (PAXIL) 10 MG tablet   Oral   Take 10 mg by mouth daily.         Bertram Gala. Polyethyl Glycol-Propyl Glycol (SYSTANE) 0.4-0.3 % GEL ophthalmic gel   Both Eyes   Place 1 application into both eyes as needed (for dry eyes).         . polyvinyl alcohol (LIQUIFILM TEARS) 1.4  % ophthalmic solution   Both Eyes   Place 1 drop into both eyes 4 (four) times daily.           Allergies Ace inhibitors; Galantamine; Maxidex; Maxzide; Procardia; and Zoloft  History reviewed. No pertinent family history.  Social History Social History  Substance Use Topics  . Smoking status: Former Games developermoker  . Smokeless tobacco: None  . Alcohol Use: No    Review of Systems Constitutional: No fever/chills Eyes: No visual changes. ENT: No sore throat. Cardiovascular: Denies chest pain. Respiratory: Denies shortness of breath. Gastrointestinal: No abdominal pain.  No nausea, no vomiting.  No diarrhea.  No constipation. Genitourinary: Negative for dysuria. Musculoskeletal: Negative for back pain. Skin: Negative for rash. Neurological: Negative for headaches, focal weakness or numbness.  10-point ROS otherwise negative.  ____________________________________________   PHYSICAL EXAM:  VITAL SIGNS: ED Triage Vitals  Enc Vitals Group     BP 04/10/15 1249 161/77 mmHg     Pulse Rate 04/10/15 1249 82     Resp 04/10/15 1249 18  Temp 04/10/15 1249 98.2 F (36.8 C)     Temp Source 04/10/15 1249 Oral     SpO2 04/10/15 1249 99 %     Weight 04/10/15 1249 105 lb (47.628 kg)     Height 04/10/15 1249  (1.575 m)     Head Cir --      Peak Flow --      Pain Score 04/10/15 1250 5     Pain Loc --      Pain Edu? --      Excl. in GC? --     Constitutional: Alert and oriented. Well appearing and in no acute distress. Eyes: Conjunctivae are normal. PERRL. EOMI. Head: Atraumatic. Nose: No congestion/rhinnorhea. Mouth/Throat: Mucous membranes are moist.   Neck: No stridor.  No tenderness to the midline C-spine. No step-off or deformity. Cardiovascular: Normal rate, regular rhythm. Grossly normal heart sounds.  Good peripheral circulation. Respiratory: Normal respiratory effort.  No retractions. Lungs CTAB. Gastrointestinal: Soft and nontender. No distention. No abdominal  bruits. No CVA tenderness. Musculoskeletal: Right lower extremity is shortened with external rotation. Able to range toes. Sensate over the course of both extremities. Dorsalis pedis pulses are present and intact the bilateral feet.  Neurologic:  Normal speech and language. No gross focal neurologic deficits are appreciated.  Skin:  Skin is warm, dry and intact. No rash noted. Psychiatric: Mood and affect are normal. Speech and behavior are normal.  ____________________________________________   LABS (all labs ordered are listed, but only abnormal results are displayed)  Labs Reviewed  CBC WITH DIFFERENTIAL/PLATELET  BASIC METABOLIC PANEL  TROPONIN I  PROTIME-INR  APTT  TYPE AND SCREEN  ABO/RH   ____________________________________________  EKG  ED ECG REPORT I, Arelia Longest, the attending physician, personally viewed and interpreted this ECG.   Date: 04/10/2015  EKG Time: 1252  Rate: 84  Rhythm: normal sinus rhythm  Axis: Normal axis  Intervals:none  ST&T Change: No ST segment elevation or depression. No abnormal T-wave inversion.  PVC times one. ____________________________________________  RADIOLOGY  No acute disease on the chest x-ray. Right hip with displaced fracture of the right femoral neck. ____________________________________________   PROCEDURES  ___________________________________________   INITIAL IMPRESSION / ASSESSMENT AND PLAN / ED COURSE  Pertinent labs & imaging results that were available during my care of the patient were reviewed by me and considered in my medical decision making (see chart for details).  ----------------------------------------- 2:38 PM on 04/10/2015 -----------------------------------------  Patient family updated about the imaging findings and the need for admission. Also discussed with Dr. Martha Clan of the orthopedic service will see the patient in consult. Signed out to Dr. Allena Katz the medicine  service. ____________________________________________   FINAL CLINICAL IMPRESSION(S) / ED DIAGNOSES  Right hip fracture.    Myrna Blazer, MD 04/10/15 [phone]

## 2015-04-10 NOTE — ED Notes (Signed)
Pt to ED via EMS from home place of [facility] after stumbling and fell from standing position, landing on right hip. Pt c/c of right hip pain, denies hitting head or LOC. Pt with dementia, baseline cognition per EMS. Slight right leg outward rotation noted at this time, no acute distress.

## 2015-04-10 NOTE — ED Notes (Signed)
Patient transported to X-ray 

## 2015-04-11 ENCOUNTER — Inpatient Hospital Stay: Payer: Medicare Other

## 2015-04-11 ENCOUNTER — Encounter
Admission: EM | Disposition: A | Discharge status: Skilled Nursing Facility | Payer: Self-pay | Source: Home / Self Care | Attending: Specialist

## 2015-04-11 ENCOUNTER — Inpatient Hospital Stay: Payer: Medicare Other | Admitting: Anesthesiology

## 2015-04-11 ENCOUNTER — Encounter: Payer: Self-pay | Admitting: Anesthesiology

## 2015-04-11 HISTORY — PX: HIP ARTHROPLASTY: SHX981

## 2015-04-11 LAB — CBC
HEMATOCRIT: 36.1 % (ref 35.0–47.0)
Hemoglobin: 12.3 g/dL (ref 12.0–16.0)
MCH: 31.1 pg (ref 26.0–34.0)
MCHC: 34.1 g/dL (ref 32.0–36.0)
MCV: 91.3 fL (ref 80.0–100.0)
PLATELETS: 238 10*3/uL (ref 150–440)
RBC: 3.95 MIL/uL (ref 3.80–5.20)
RDW: 12.7 % (ref 11.5–14.5)
WBC: 8.2 10*3/uL (ref 3.6–11.0)

## 2015-04-11 LAB — BASIC METABOLIC PANEL
ANION GAP: 7 (ref 5–15)
BUN: 15 mg/dL (ref 6–20)
CHLORIDE: 93 mmol/L — AB (ref 101–111)
CO2: 30 mmol/L (ref 22–32)
Calcium: 9.3 mg/dL (ref 8.9–10.3)
Creatinine, Ser: 0.98 mg/dL (ref 0.44–1.00)
GFR, EST AFRICAN AMERICAN: 60 mL/min — AB (ref >60)
GFR, EST NON AFRICAN AMERICAN: 52 mL/min — AB (ref >60)
Glucose, Bld: 125 mg/dL — ABNORMAL HIGH (ref 65–99)
POTASSIUM: 3.2 mmol/L — AB (ref 3.5–5.1)
SODIUM: 130 mmol/L — AB (ref 135–145)

## 2015-04-11 SURGERY — HEMIARTHROPLASTY, HIP, DIRECT ANTERIOR APPROACH, FOR FRACTURE
Anesthesia: Spinal | Laterality: Right

## 2015-04-11 MED ORDER — CEFAZOLIN SODIUM 1-5 GM-% IV SOLN
1.0000 g | Freq: Four times a day (QID) | INTRAVENOUS | Status: AC
  Administered 2015-04-11 (×2): 1 g via INTRAVENOUS
  Filled 2015-04-11 (×2): qty 50

## 2015-04-11 MED ORDER — BUPIVACAINE HCL (PF) 0.5 % IJ SOLN
INTRAMUSCULAR | Status: DC | PRN
  Administered 2015-04-11: 2.5 mL

## 2015-04-11 MED ORDER — NEOMYCIN-POLYMYXIN B GU 40-200000 IR SOLN
Status: AC
  Filled 2015-04-11: qty 20

## 2015-04-11 MED ORDER — ONDANSETRON HCL 4 MG/2ML IJ SOLN: 4.0000 mg | Freq: Once | INTRAMUSCULAR | Status: DC | PRN

## 2015-04-11 MED ORDER — ACETAMINOPHEN 650 MG RE SUPP: 650.0000 mg | Freq: Four times a day (QID) | RECTAL | Status: DC | PRN

## 2015-04-11 MED ORDER — MENTHOL 3 MG MT LOZG: 1.0000 | LOZENGE | OROMUCOSAL | Status: DC | PRN

## 2015-04-11 MED ORDER — BISACODYL 10 MG RE SUPP: 10.0000 mg | Freq: Every day | RECTAL | Status: DC | PRN

## 2015-04-11 MED ORDER — FENTANYL CITRATE (PF) 100 MCG/2ML IJ SOLN
INTRAMUSCULAR | Status: DC | PRN
  Administered 2015-04-11: 25 ug via INTRAVENOUS

## 2015-04-11 MED ORDER — KETAMINE HCL 10 MG/ML IJ SOLN
INTRAMUSCULAR | Status: DC | PRN
  Administered 2015-04-11: 25 mg via INTRAVENOUS

## 2015-04-11 MED ORDER — POLYETHYLENE GLYCOL 3350 17 G PO PACK
17.0000 g | PACK | Freq: Every day | ORAL | Status: DC | PRN
  Administered 2015-04-12: 17 g via ORAL
  Filled 2015-04-11: qty 1

## 2015-04-11 MED ORDER — MORPHINE SULFATE (PF) 2 MG/ML IV SOLN: 2.0000 mg | INTRAVENOUS | Status: DC | PRN

## 2015-04-11 MED ORDER — FENTANYL CITRATE (PF) 100 MCG/2ML IJ SOLN
INTRAMUSCULAR | Status: AC
  Filled 2015-04-11: qty 2

## 2015-04-11 MED ORDER — DOCUSATE SODIUM 100 MG PO CAPS
100.0000 mg | ORAL_CAPSULE | Freq: Two times a day (BID) | ORAL | Status: DC
Start: 2015-04-11 — End: 2015-04-13
  Administered 2015-04-12 – 2015-04-13 (×3): 100 mg via ORAL
  Filled 2015-04-11 (×3): qty 1

## 2015-04-11 MED ORDER — PROPOFOL 10 MG/ML IV BOLUS
INTRAVENOUS | Status: DC | PRN
  Administered 2015-04-11: 30 mg via INTRAVENOUS

## 2015-04-11 MED ORDER — ACETAMINOPHEN 325 MG PO TABS
650.0000 mg | ORAL_TABLET | Freq: Four times a day (QID) | ORAL | Status: DC | PRN
  Administered 2015-04-12 – 2015-04-13 (×2): 650 mg via ORAL
  Filled 2015-04-11 (×2): qty 2

## 2015-04-11 MED ORDER — MAGNESIUM CITRATE PO SOLN: 1.0000 | Freq: Once | ORAL | Status: DC | PRN

## 2015-04-11 MED ORDER — POTASSIUM CHLORIDE CRYS ER 20 MEQ PO TBCR
20.0000 meq | EXTENDED_RELEASE_TABLET | Freq: Two times a day (BID) | ORAL | Status: DC
  Administered 2015-04-11 – 2015-04-13 (×4): 20 meq via ORAL
  Filled 2015-04-11 (×4): qty 1

## 2015-04-11 MED ORDER — SODIUM CHLORIDE 0.9 % IV SOLN
75.0000 mL/h | INTRAVENOUS | Status: DC
  Administered 2015-04-11 – 2015-04-12 (×2): 75 mL/h via INTRAVENOUS

## 2015-04-11 MED ORDER — PHENOL 1.4 % MT LIQD: 1.0000 | OROMUCOSAL | Status: DC | PRN

## 2015-04-11 MED ORDER — FENTANYL CITRATE (PF) 100 MCG/2ML IJ SOLN: 25.0000 ug | INTRAMUSCULAR | Status: DC | PRN

## 2015-04-11 MED ORDER — ALUM & MAG HYDROXIDE-SIMETH 200-200-20 MG/5ML PO SUSP: 30.0000 mL | ORAL | Status: DC | PRN

## 2015-04-11 MED ORDER — LIDOCAINE HCL (CARDIAC) 20 MG/ML IV SOLN
INTRAVENOUS | Status: DC | PRN
  Administered 2015-04-11: 40 mg via INTRAVENOUS

## 2015-04-11 MED ORDER — NEOMYCIN-POLYMYXIN B GU 40-200000 IR SOLN
Status: DC | PRN
  Administered 2015-04-11: 16 mL

## 2015-04-11 MED ORDER — ONDANSETRON HCL 4 MG PO TABS: 4.0000 mg | ORAL_TABLET | Freq: Four times a day (QID) | ORAL | Status: DC | PRN

## 2015-04-11 MED ORDER — CEFAZOLIN SODIUM-DEXTROSE 2-3 GM-% IV SOLR
INTRAVENOUS | Status: DC | PRN
  Administered 2015-04-11: 2 g via INTRAVENOUS

## 2015-04-11 MED ORDER — ENOXAPARIN SODIUM 30 MG/0.3ML ~~LOC~~ SOLN
30.0000 mg | Freq: Two times a day (BID) | SUBCUTANEOUS | Status: DC
  Administered 2015-04-12 – 2015-04-13 (×3): 30 mg via SUBCUTANEOUS
  Filled 2015-04-11 (×3): qty 0.3

## 2015-04-11 MED ORDER — PROPOFOL 500 MG/50ML IV EMUL
INTRAVENOUS | Status: DC | PRN
  Administered 2015-04-11: 45 ug/kg/min via INTRAVENOUS

## 2015-04-11 MED ORDER — ONDANSETRON HCL 4 MG/2ML IJ SOLN: 4.0000 mg | Freq: Four times a day (QID) | INTRAMUSCULAR | Status: DC | PRN

## 2015-04-11 MED ORDER — FENTANYL CITRATE (PF) 100 MCG/2ML IJ SOLN
25.0000 ug | INTRAMUSCULAR | Status: DC | PRN
  Administered 2015-04-11 (×2): 25 ug via INTRAVENOUS

## 2015-04-11 MED ORDER — HYDROCODONE-ACETAMINOPHEN 5-325 MG PO TABS
1.0000 | ORAL_TABLET | Freq: Four times a day (QID) | ORAL | Status: DC | PRN
  Administered 2015-04-11 – 2015-04-13 (×4): 1 via ORAL
  Filled 2015-04-11 (×5): qty 1

## 2015-04-11 SURGICAL SUPPLY — 51 items
BLADE SAGITTAL WIDE XTHICK NO (BLADE) ×2 IMPLANT
BLADE SURG SZ10 CARB STEEL (BLADE) ×2 IMPLANT
CANISTER SUCT 1200ML W/VALVE (MISCELLANEOUS) ×2 IMPLANT
CANISTER SUCT 3000ML PPV (MISCELLANEOUS) ×4 IMPLANT
CAPT HIP HEMI 1 ×2 IMPLANT
CAPT HIP HEMI 2 IMPLANT
DRAPE IMP U-DRAPE 54X76 (DRAPES) ×2 IMPLANT
DRAPE INCISE IOBAN 66X60 STRL (DRAPES) ×2 IMPLANT
DRAPE SHEET LG 3/4 BI-LAMINATE (DRAPES) ×4 IMPLANT
DRAPE SURG 17X11 SM STRL (DRAPES) ×2 IMPLANT
DRAPE TABLE BACK 80X90 (DRAPES) ×2 IMPLANT
DRSG OPSITE POSTOP 4X12 (GAUZE/BANDAGES/DRESSINGS) ×2 IMPLANT
DURAPREP 26ML APPLICATOR (WOUND CARE) ×6 IMPLANT
ELECT BLADE 6.5 EXT (BLADE) ×2 IMPLANT
ELECT CAUTERY BLADE 6.4 (BLADE) ×2 IMPLANT
GAUZE PETRO XEROFOAM 1X8 (MISCELLANEOUS) IMPLANT
GAUZE SPONGE 4X4 12PLY STRL (GAUZE/BANDAGES/DRESSINGS) IMPLANT
GLOVE BIO SURGEON STRL SZ7.5 (GLOVE) ×2 IMPLANT
GLOVE BIOGEL PI IND STRL 9 (GLOVE) ×2 IMPLANT
GLOVE BIOGEL PI INDICATOR 9 (GLOVE) ×2
GLOVE INDICATOR 7.5 STRL GRN (GLOVE) ×2 IMPLANT
GLOVE SURG 9.0 ORTHO LTXF (GLOVE) ×2 IMPLANT
GOWN STRL REUS TWL 2XL XL LVL4 (GOWN DISPOSABLE) ×2 IMPLANT
GOWN STRL REUS W/ TWL LRG LVL3 (GOWN DISPOSABLE) ×1 IMPLANT
GOWN STRL REUS W/TWL LRG LVL3 (GOWN DISPOSABLE) ×1
HANDPIECE SUCTION TUBG SURGILV (MISCELLANEOUS) ×2 IMPLANT
HEMOVAC 400ML (MISCELLANEOUS)
HIP CAPITATED HEMI 1 ×1 IMPLANT
HIP CAPITATED HEMI 2 IMPLANT
HOLDER DRSG NASAL ADJUST (MISCELLANEOUS) ×2 IMPLANT
KIT DRAIN HEMOVAC JP 7FR 400ML (MISCELLANEOUS) IMPLANT
KIT RM TURNOVER STRD PROC AR (KITS) ×2 IMPLANT
NDL SAFETY 18GX1.5 (NEEDLE) ×2 IMPLANT
NEEDLE FILTER BLUNT 18X 1/2SAF (NEEDLE)
NEEDLE FILTER BLUNT 18X1 1/2 (NEEDLE) IMPLANT
NEEDLE MAYO CATGUT SZ4 (NEEDLE) IMPLANT
NS IRRIG 1000ML POUR BTL (IV SOLUTION) ×2 IMPLANT
PACK HIP PROSTHESIS (MISCELLANEOUS) ×2 IMPLANT
PAD GROUND ADULT SPLIT (MISCELLANEOUS) ×2 IMPLANT
PILLOW ABDUC SM (MISCELLANEOUS) ×2 IMPLANT
RETRIEVER SUT HEWSON (MISCELLANEOUS) ×2 IMPLANT
SOL .9 NS 3000ML IRR  AL (IV SOLUTION) ×1
SOL .9 NS 3000ML IRR UROMATIC (IV SOLUTION) ×1 IMPLANT
STAPLER SKIN PROX 35W (STAPLE) ×2 IMPLANT
SUT MNCRL 3 0 RB1 (SUTURE) IMPLANT
SUT MONOCRYL 3 0 RB1 (SUTURE)
SUT TICRON 2-0 30IN 311381 (SUTURE) ×10 IMPLANT
SUT VIC AB 0 CT1 36 (SUTURE) ×2 IMPLANT
SUT VIC AB 2-0 CT2 27 (SUTURE) ×4 IMPLANT
SYRINGE 10CC LL (SYRINGE) ×2 IMPLANT
TAPE MICROFOAM 4IN (TAPE) IMPLANT

## 2015-04-11 NOTE — Op Note (Signed)
04/10/2015 - 04/11/2015  1:43 PM  PATIENT:  Mercedes Powell   MRN: [id number]  PRE-OPERATIVE DIAGNOSIS:  right femoral neck  fracture  POST-OPERATIVE DIAGNOSIS:  right femoral neck fracture  PROCEDURE:  Procedure(s): RIGHT HIP ARTHROPLASTY BIPOLAR (HEMIARTHROPLASTY)  PREOPERATIVE INDICATIONS:    Mercedes Powell is an 81 y.o. female who has a diagnosis of right femoral neck  fracture and agreed with surgical management of his fracture.  The risks benefits and alternatives were discussed with the patient and their family including but not limited to the risks of  infection requiring removal of the prosthesis, bleeding requiring blood transfusion, nerve injury especially to the sciatic nerve leading to foot drop or lower extremity numbness, periprosthetic fracture, dislocation leg length discrepancy, change in lower extremity rotation persistent hip pain, loosening or failure of the components and the need for revision surgery. Medical risks include but are not limited to DVT and pulmonary embolism, myocardial infarction, stroke, pneumonia, respiratory failure and death.  OPERATIVE REPORT     SURGEON:   , MD   ANESTHESIA:  Spinal    COMPLICATIONS:  None.   SPECIMEN: Femoral head to pathology    COMPONENTS:  Stryker Accolade HFx femoral component size 3 with a 45 mm Stryker Unitrax head with a -4 neck adjustment sleeve.    PROCEDURE IN DETAIL:   The patient was met in the holding area and  identified.  The appropriate hip was identified and marked at the operative site after verbally confirming with the patient that this was the correct site of surgery.  The patient was then transported to the OR  and  underwent spinal anesthesia.  The patient was then placed in the lateral decubitus position with the operative side up and secured on the operating room table with a pegboard and all bony prominences were adequately padded. This included an axillary roll and additional padding  around the nonoperative leg to prevent compression to the common peroneal nerve.    The operative lower extremity was prepped and draped in a sterile fashion.  A time out was performed prior to incision to verify patient's name, date of birth, medical record number, correct site of surgery correct procedure to be performed. Was also used to verify the patient received antibiotics now appropriate instruments, implants and radiographic studies were available in the room. Once all in attendance were in agreement case began.    A posterolateral approach was utilized via sharp dissection  carried down to the subcutaneous tissue.  Bleeding vessels were coagulated using electrocautery.  The fascia lata was identified and incised along the length of the skin incision.  The gluteus maximus muscle was then split in line with its fibers. Self-retaining retractors were  inserted.  With the hip internally rotated, the short external rotators  were identified and removed from the posterior attachment from the greater trochanter. The piriformis was tagged for later repair. The capsule was identified and a T-shaped capsulotomy was performed. The capsule was tagged with #2 Tycron for later repair.  The femoral neck fracture was exposed, and the femoral head was removed using a corkscrew device. This was measured to be 45 mm in diameter. The attention was then turned to proximal femur preparation.  An oscillating saw was used to perform a proximal femoral osteotomy 1 fingerbreadth above the lesser trochanter. The trial 45 mm femoral head was placed into the acetabulum and had an excellent suction fit. The attention was then turned back to femoral preparation.      A femoral skid and Cobra retractor were placed under the femoral neck to allow for adequate visualization. A box osteotome was used to make the initial entry into the proximal femur. A single hand reamer was used to prepare the femoral canal. A T-shaped femoral canal  sounder was then used to ensure no penetration femoral cortex had occurred during reaming. The proximal femur was then sequentially broached by hand. A size 3 femoral trial was found to have best medial to lateral canal fit. Once adequate mediolateral canal fill was achieved the trial femoral broach, neck, and head trials were assembled and placed on the femoral stem trial and the hip was reduced. It was found to have excellent stability, equivalent leg lengths with functional range of motion. The trial components were then removed.  I copiously irrigated the femoral canal and then impacted the real femoral prosthesis into place into the appropriate version, slightly anteverted to the normal anatomy, and I impacted the actual 45 mm  Unitrax femoral component with a -4 neck adjustment sleeve into place. The hip was then reduced and taken through functional range of motion and found to have excellent stability. Leg lengths were restored.   The hip joint was copiously irrigated.   A soft tissue repair of the capsule and piriformis was performed using #2 Tycron Excellent posterior capsular repair was achieved. The fascia lata was then closed with interrupted 0 Vicryl suture. The subcutaneous tissues were closed with 2-0 Vicryl and the skin approximated with staples.   The patient was then placed supine on the operative table. Leg lengths were checked clinically and found to be equivalent. An abduction pillow was placed between the lower extremities. The patient was then transferred to a hospital bed and brought to the PACU in stable condition. I was scrubbed and present the entire case and all sharp and instrument counts were correct at the conclusion of the case. I spoke with the patient's family in the postop consultation room to let the case was completed without complication patient was stable in recovery room.   Timoteo Gaul, MD Orthopedic Surgeon

## 2015-04-11 NOTE — Progress Notes (Signed)
Pt seen by Dr. Quentin CornwallSanani in PACU Hip XRAY performed in PACU

## 2015-04-11 NOTE — Anesthesia Postprocedure Evaluation (Signed)
Anesthesia Post Note  Patient: Mercedes EdwardsBetty J Powell  Procedure(s) Performed: Procedure(s) (LRB): ARTHROPLASTY BIPOLAR HIP (HEMIARTHROPLASTY) (Right)  Patient location during evaluation: PACU Anesthesia Type: Spinal Level of consciousness: awake Pain management: pain level controlled Vital Signs Assessment: post-procedure vital signs reviewed and stable Respiratory status: spontaneous breathing Cardiovascular status: blood pressure returned to baseline Anesthetic complications: no    Last Vitals:  Filed Vitals:   04/11/15 1454 04/11/15 1529  BP: 160/54 161/55  Pulse: 69 65  Temp: 35.9 C 37.1 C  Resp: 18 18    Last Pain:  Filed Vitals:   04/11/15 1628  PainSc: 0-No pain                 Madilyne Tadlock S

## 2015-04-11 NOTE — Anesthesia Procedure Notes (Signed)
Spinal Patient location during procedure: OR Staffing Anesthesiologist: Berdine AddisonHOMAS, Moira Umholtz Performed by: anesthesiologist  Preanesthetic Checklist Completed: patient identified, site marked, surgical consent, pre-op evaluation, timeout performed, IV checked and risks and benefits discussed Spinal Block Patient position: sitting Prep: Betadine Patient monitoring: heart rate, cardiac monitor, continuous pulse ox and blood pressure Approach: midline Location: L3-4 Injection technique: single-shot Needle Needle type: Pencil-Tip  Needle gauge: 25 G Needle length: 9 cm Assessment Sensory level: T10 Additional Notes 1115 am intrathecal 0.5% marcaine 2.5 ml.

## 2015-04-11 NOTE — Anesthesia Preprocedure Evaluation (Addendum)
Anesthesia Evaluation  Patient identified by MRN, date of birth, ID band Patient awake    Reviewed: Allergy & Precautions, NPO status , Patient's Chart, lab work & pertinent test results, reviewed documented beta blocker date and time   Airway Mallampati: II  TM Distance: >3 FB     Dental  (+) Chipped   Pulmonary former smoker,           Cardiovascular hypertension, Pt. on medications      Neuro/Psych PSYCHIATRIC DISORDERS Anxiety    GI/Hepatic   Endo/Other  Hypothyroidism   Renal/GU      Musculoskeletal   Abdominal   Peds  Hematology   Anesthesia Other Findings EKG poor R wave prog. Leg pain. Raynauds.Alzheimers. K 3.2. CXR ok. Hx of frequent PVCs.  Reproductive/Obstetrics                            Anesthesia Physical Anesthesia Plan  ASA: III  Anesthesia Plan: Spinal   Post-op Pain Management:    Induction: Intravenous  Airway Management Planned:   Additional Equipment:   Intra-op Plan:   Post-operative Plan:   Informed Consent: I have reviewed the patients History and Physical, chart, labs and discussed the procedure including the risks, benefits and alternatives for the proposed anesthesia with the patient or authorized representative who has indicated his/her understanding and acceptance.     Plan Discussed with: CRNA  Anesthesia Plan Comments:         Anesthesia Quick Evaluation

## 2015-04-11 NOTE — Progress Notes (Signed)
[facility] [place] [facility]   PATIENT NAME: Mercedes Powell    MR#:  [id number]  DATE OF BIRTH:  1929/03/06  SUBJECTIVE:  CHIEF COMPLAINT:   Chief Complaint  Patient presents with  . Hip Pain   Patient here with a hip fracture secondary to fall. Status post right hip replacement today and seen in the PACU. A bit lethargic.   REVIEW OF SYSTEMS:    Review of Systems  Unable to perform ROS: mental acuity   Nutrition: Clear liquid Tolerating Diet: Not Yet Tolerating PT: Await Eval.    DRUG ALLERGIES:   Allergies  Allergen Reactions  . Ace Inhibitors Swelling  . Galantamine Diarrhea  . Maxidex [Dexamethasone] Other (See Comments)    Reaction:  Unknown   . Maxzide [Hydrochlorothiazide W-Triamterene] Other (See Comments)    Reaction:  Hyponatremia   . Procardia [Nifedipine] Swelling  . Zoloft [Sertraline Hcl] Diarrhea    VITALS:  Blood pressure 160/54, pulse 69, temperature 96.7 F (35.9 C), temperature source Axillary, resp. rate 18, height 5\' 2"  (1.575 m), weight 47.628 kg (105 lb), SpO2 100 %.  PHYSICAL EXAMINATION:   Physical Exam  GENERAL:  81 y.o.-year-old patient lying in the bed lethargic post-operatively.  EYES: Pupils equal, round, reactive to light. No scleral icterus. Extraocular muscles intact.  HEENT: Head atraumatic, normocephalic. Oropharynx and nasopharynx clear.  NECK:  Supple, no jugular venous distention. No thyroid enlargement, no tenderness.  LUNGS: Normal breath sounds bilaterally, no wheezing, rales, rhonchi. No use of accessory muscles of respiration.  CARDIOVASCULAR: S1, S2 normal. No murmurs, rubs, or gallops.  ABDOMEN: Soft, nontender, nondistended. Bowel sounds present. No organomegaly or mass.  EXTREMITIES: No cyanosis, clubbing or edema b/l. Right hip dressing from surgery today.    NEUROLOGIC: Cranial nerves II through XII are intact. No focal Motor or sensory deficits b/l.  Globally weak.  PSYCHIATRIC: The  patient is alert and oriented x 1. Lethargic post-operatively.   SKIN: No obvious rash, lesion, or ulcer.    LABORATORY PANEL:   CBC  Recent Labs Lab 04/11/15 0610  WBC 8.2  HGB 12.3  HCT 36.1  PLT 238   ------------------------------------------------------------------------------------------------------------------  Chemistries   Recent Labs Lab 04/11/15 0610  NA 130*  K 3.2*  CL 93*  CO2 30  GLUCOSE 125*  BUN 15  CREATININE 0.98  CALCIUM 9.3   ------------------------------------------------------------------------------------------------------------------  Cardiac Enzymes  Recent Labs Lab 04/10/15 1332  TROPONINI <0.03   ------------------------------------------------------------------------------------------------------------------  RADIOLOGY:  Dg Chest 1 View  04/10/2015  CLINICAL DATA:  Fall, hypertension, right hip pain EXAM: CHEST 1 VIEW COMPARISON:  02/27/2015 FINDINGS: Cardiomediastinal silhouette is stable. No acute infiltrate or pleural effusion. No pulmonary edema. Bony thorax is unremarkable. IMPRESSION: No active disease. Electronically Signed   By: Natasha MeadLiviu  Pop M.D.   On: 04/10/2015 14:29   Dg Hip Port Unilat With Pelvis 1v Right  04/11/2015  CLINICAL DATA:  Right hip fracture. EXAM: DG HIP (WITH OR WITHOUT PELVIS) 1V PORT RIGHT COMPARISON:  Radiographs dated 04/10/2015 FINDINGS: AP and lateral views demonstrate the patient has undergone right hip hemiarthroplasty. The prosthesis appears in excellent position in the AP and lateral projection. No appreciable fractures. Postsurgical air in the soft tissues as expected. IMPRESSION: Satisfactory appearance of the right hip after hemiarthroplasty. Electronically Signed   By: Francene BoyersJames  Maxwell M.D.   On: 04/11/2015 14:19   Dg Hip Unilat  With Pelvis 2-3 Views Right  04/10/2015  CLINICAL DATA:  Fall, right hip pain  EXAM: DG HIP (WITH OR WITHOUT PELVIS) 2-3V RIGHT COMPARISON:  None. FINDINGS: Three views of  the right hip submitted. Diffuse osteopenia. There is displaced fracture of the right femoral neck. Degenerative changes pubic symphysis. IMPRESSION: Diffuse osteopenia.  Displaced fracture of the right femoral neck. Electronically Signed   By: Natasha Mead M.D.   On: 04/10/2015 14:26     ASSESSMENT AND PLAN:   81 year old female with past medical history of hypertension, anxiety, hypothyroidism, Raynaud's phenomenon, dementia who presented to the hospital due to mechanical fall and noted to have right hip fracture.  #1 status post fall and right hip fracture-status post ORIF today. -Continue pain control and care as per orthopedics. - PT as tolerated.   #2 hypertension-continue Norvasc  #3 anxiety-continue Paxil.  #4 Hypothyroidism-continue Synthroid.  #5 Hypokalemia - cont. Oral supplements and will monitor.      All the records are reviewed and case discussed with Care Management/Social Workerr. Management plans discussed with the patient, family and they are in agreement.  CODE STATUS: Full   DVT Prophylaxis: Lovenox  TOTAL TIME TAKING CARE OF THIS PATIENT: 30 minutes.   POSSIBLE D/C IN 2-3 DAYS, DEPENDING ON CLINICAL CONDITION.   Houston Siren M.D on 04/11/2015 at 3:20 PM  Between 7am to 6pm - Pager - [phone]  After 6pm go to [email or web address] - password [email or web address] [facility]  [facility]  Office  [phone]  CC: Primary care physician; No primary care provider on file.

## 2015-04-11 NOTE — Progress Notes (Signed)
ANTICOAGULATION CONSULT NOTE -FOLLOW UP   Pharmacy Consult for Lovenox  Indication: VTE prophylaxis  Allergies  Allergen Reactions  . Ace Inhibitors Swelling  . Galantamine Diarrhea  . Maxidex [Dexamethasone] Other (See Comments)    Reaction:  Unknown   . Maxzide [Hydrochlorothiazide W-Triamterene] Other (See Comments)    Reaction:  Hyponatremia   . Procardia [Nifedipine] Swelling  . Zoloft [Sertraline Hcl] Diarrhea    Patient Measurements: Height: 5\' 2"  (157.5 cm) Weight: 105 lb (47.628 kg) IBW/kg (Calculated) : 50.1 Heparin Dosing Weight:   Vital Signs: Temp: 96.7 F (35.9 C) (12/20 1454) Temp Source: Axillary (12/20 1454) BP: 160/54 mmHg (12/20 1454) Pulse Rate: 69 (12/20 1454)  Labs:  Recent Labs  04/10/15 1332 04/11/15 0610  HGB 12.9 12.3  HCT 38.3 36.1  PLT 259 238  APTT 31  --   LABPROT 12.9  --   INR 0.95  --   CREATININE 1.22* 0.98  TROPONINI <0.03  --     Estimated Creatinine Clearance: 32.1 mL/min (by C-G formula based on Cr of 0.98).   Medical History: Past Medical History  Diagnosis Date  . Hypertension   . Anxiety   . Chronic leg pain   . HTN (hypertension)   . Hypothyroid   . Raynaud phenomenon   . Dementia     Medications:  Scheduled:  . amLODipine  5 mg Oral Once  . calcium carbonate  500 mg Oral Daily  .  ceFAZolin (ANCEF) IV  1 g Intravenous Q6H  . cholecalciferol  2,000 Units Oral Daily  . docusate sodium  100 mg Oral BID  . enoxaparin (LOVENOX) injection  30 mg Subcutaneous Q12H  . fentaNYL      . levothyroxine  75 mcg Oral QAC breakfast  . PARoxetine  10 mg Oral Daily    Assessment: CrCl: 32 ml/min  Goal of Therapy:  DVT prophylaxis    Plan:  Continue lovenox 30 mg SQ BID.   Kai Railsback D 04/11/2015,3:22 PM

## 2015-04-11 NOTE — Progress Notes (Signed)
Patient family verbalized concern that the patients renal status is unknown to physicians. Her son stated his mother kidneys are not functioning fully. I reassured him I  Would pass this along to the next shift so her medical doctor can be made aware of her status. Pt sees Dr Thedore MinsSingh with Nephrology and was hospitalized last year for a "Kidneyl problem" related to ABX use. Information passed on to night shift nurse.

## 2015-04-11 NOTE — Transfer of Care (Signed)
Immediate Anesthesia Transfer of Care Note  Patient: Mercedes EdwardsBetty J Powell  Procedure(s) Performed: Procedure(s): ARTHROPLASTY BIPOLAR HIP (HEMIARTHROPLASTY) (Right)  Patient Location: PACU  Anesthesia Type:GA combined with regional for post-op pain  Level of Consciousness: sedated  Airway & Oxygen Therapy: Patient Spontanous Breathing and Patient connected to face mask oxygen  Post-op Assessment: Report given to RN and Post -op Vital signs reviewed and stable  Post vital signs: Reviewed and stable  Last Vitals:  Filed Vitals:   04/11/15 0927 04/11/15 1342  BP: 144/58 135/63  Pulse: 71 68  Temp: 36.1 C 98.19F  Resp: 16 19    Complications: No apparent anesthesia complications

## 2015-04-11 NOTE — Progress Notes (Signed)
Subjective:  Patient is seen in pre-op area. Patient reports pain as mild.  She has no other complaints.  Objective:   VITALS:   Filed Vitals:   04/10/15 2025 04/11/15 0359 04/11/15 0741 04/11/15 0927  BP: 144/60 145/59 141/55 144/58  Pulse: 84 72 66 71  Temp: 98.7 F (37.1 C) 98.6 F (37 C) 98.5 F (36.9 C) 97 F (36.1 C)  TempSrc: Oral Oral Oral Oral  Resp: 18 18 18 16   Height:    5\' 2"  (1.575 m)  Weight:    47.628 kg (105 lb)  SpO2: 96% 95% 95% 95%    PHYSICAL EXAM:  Right hip:  Skin is intact.  No erythema, swelling or ecchymosis seen.  Neurovascularly intact.  Intact motor function.  R LE shortened and externally rotated.   LABS  Results for orders placed or performed during the hospital encounter of 04/10/15 (from the past 24 hour(s))  CBC with Differential     Status: None   Collection Time: 04/10/15  1:32 PM  Result Value Ref Range   WBC 7.8 3.6 - 11.0 K/uL   RBC 4.24 3.80 - 5.20 MIL/uL   Hemoglobin 12.9 12.0 - 16.0 g/dL   HCT 96.038.3 45.435.0 - 09.847.0 %   MCV 90.4 80.0 - 100.0 fL   MCH 30.5 26.0 - 34.0 pg   MCHC 33.8 32.0 - 36.0 g/dL   RDW 11.912.4 14.711.5 - 82.914.5 %   Platelets 259 150 - 440 K/uL   Neutrophils Relative % 76 %   Neutro Abs 5.9 1.4 - 6.5 K/uL   Lymphocytes Relative 15 %   Lymphs Abs 1.1 1.0 - 3.6 K/uL   Monocytes Relative 9 %   Monocytes Absolute 0.7 0.2 - 0.9 K/uL   Eosinophils Relative 0 %   Eosinophils Absolute 0.0 0 - 0.7 K/uL   Basophils Relative 0 %   Basophils Absolute 0.0 0 - 0.1 K/uL  Basic metabolic panel     Status: Abnormal   Collection Time: 04/10/15  1:32 PM  Result Value Ref Range   Sodium 131 (L) 135 - 145 mmol/L   Potassium 3.3 (L) 3.5 - 5.1 mmol/L   Chloride 89 (L) 101 - 111 mmol/L   CO2 32 22 - 32 mmol/L   Glucose, Bld 134 (H) 65 - 99 mg/dL   BUN 18 6 - 20 mg/dL   Creatinine, Ser 5.621.22 (H) 0.44 - 1.00 mg/dL   Calcium 13.010.0 8.9 - 86.510.3 mg/dL   GFR calc non Af Amer 40 (L) >60 mL/min   GFR calc Af Amer 46 (L) >60 mL/min   Anion  gap 10 5 - 15  Troponin I     Status: None   Collection Time: 04/10/15  1:32 PM  Result Value Ref Range   Troponin I <0.03 <0.031 ng/mL  Protime-INR     Status: None   Collection Time: 04/10/15  1:32 PM  Result Value Ref Range   Prothrombin Time 12.9 11.4 - 15.0 seconds   INR 0.95   APTT     Status: None   Collection Time: 04/10/15  1:32 PM  Result Value Ref Range   aPTT 31 24 - 36 seconds  Type and screen [facility]     Status: None   Collection Time: 04/10/15  1:32 PM  Result Value Ref Range   ABO/RH(D) O POS    Antibody Screen NEG    Sample Expiration 04/13/2015   ABO/Rh     Status: None  Collection Time: 04/10/15  1:33 PM  Result Value Ref Range   ABO/RH(D) O POS   Urinalysis complete, with microscopic ([facility] only)     Status: Abnormal   Collection Time: 04/10/15  6:30 PM  Result Value Ref Range   Color, Urine YELLOW (A) YELLOW   APPearance CLEAR (A) CLEAR   Glucose, UA NEGATIVE NEGATIVE mg/dL   Bilirubin Urine NEGATIVE NEGATIVE   Ketones, ur TRACE (A) NEGATIVE mg/dL   Specific Gravity, Urine 1.009 1.005 - 1.030   Hgb urine dipstick NEGATIVE NEGATIVE   pH 7.0 5.0 - 8.0   Protein, ur NEGATIVE NEGATIVE mg/dL   Nitrite NEGATIVE NEGATIVE   Leukocytes, UA NEGATIVE NEGATIVE   RBC / HPF 0-5 0 - 5 RBC/hpf   WBC, UA 0-5 0 - 5 WBC/hpf   Bacteria, UA NONE SEEN NONE SEEN   Squamous Epithelial / LPF 0-5 (A) NONE SEEN  MRSA PCR Screening     Status: None   Collection Time: 04/10/15  6:30 PM  Result Value Ref Range   MRSA by PCR NEGATIVE NEGATIVE  CBC     Status: None   Collection Time: 04/11/15  6:10 AM  Result Value Ref Range   WBC 8.2 3.6 - 11.0 K/uL   RBC 3.95 3.80 - 5.20 MIL/uL   Hemoglobin 12.3 12.0 - 16.0 g/dL   HCT 16.1 09.6 - 04.5 %   MCV 91.3 80.0 - 100.0 fL   MCH 31.1 26.0 - 34.0 pg   MCHC 34.1 32.0 - 36.0 g/dL   RDW 40.9 81.1 - 91.4 %   Platelets 238 150 - 440 K/uL  Basic metabolic panel     Status: Abnormal   Collection Time: 04/11/15   6:10 AM  Result Value Ref Range   Sodium 130 (L) 135 - 145 mmol/L   Potassium 3.2 (L) 3.5 - 5.1 mmol/L   Chloride 93 (L) 101 - 111 mmol/L   CO2 30 22 - 32 mmol/L   Glucose, Bld 125 (H) 65 - 99 mg/dL   BUN 15 6 - 20 mg/dL   Creatinine, Ser 7.82 0.44 - 1.00 mg/dL   Calcium 9.3 8.9 - 95.6 mg/dL   GFR calc non Af Amer 52 (L) >60 mL/min   GFR calc Af Amer 60 (L) >60 mL/min   Anion gap 7 5 - 15    Dg Chest 1 View  04/10/2015  CLINICAL DATA:  Fall, hypertension, right hip pain EXAM: CHEST 1 VIEW COMPARISON:  02/27/2015 FINDINGS: Cardiomediastinal silhouette is stable. No acute infiltrate or pleural effusion. No pulmonary edema. Bony thorax is unremarkable. IMPRESSION: No active disease. Electronically Signed   By: Natasha Mead M.D.   On: 04/10/2015 14:29   Dg Hip Unilat  With Pelvis 2-3 Views Right  04/10/2015  CLINICAL DATA:  Fall, right hip pain EXAM: DG HIP (WITH OR WITHOUT PELVIS) 2-3V RIGHT COMPARISON:  None. FINDINGS: Three views of the right hip submitted. Diffuse osteopenia. There is displaced fracture of the right femoral neck. Degenerative changes pubic symphysis. IMPRESSION: Diffuse osteopenia.  Displaced fracture of the right femoral neck. Electronically Signed   By: Natasha Mead M.D.   On: 04/10/2015 14:26    Assessment/Plan: Day of Surgery   Active Problems:   Hip fracture [facility])  Patient is NPO.  Cleared for surgery.  To have right hip hemiarthroplasty this AM.   Juanell Fairly , MD 04/11/2015, 11:13 AM

## 2015-04-12 ENCOUNTER — Encounter: Payer: Self-pay | Admitting: Orthopedic Surgery

## 2015-04-12 LAB — CBC
HEMATOCRIT: 30.9 % — AB (ref 35.0–47.0)
HEMOGLOBIN: 10.4 g/dL — AB (ref 12.0–16.0)
MCH: 30.2 pg (ref 26.0–34.0)
MCHC: 33.5 g/dL (ref 32.0–36.0)
MCV: 90 fL (ref 80.0–100.0)
Platelets: 197 10*3/uL (ref 150–440)
RBC: 3.43 MIL/uL — ABNORMAL LOW (ref 3.80–5.20)
RDW: 12.4 % (ref 11.5–14.5)
WBC: 8.5 10*3/uL (ref 3.6–11.0)

## 2015-04-12 LAB — BASIC METABOLIC PANEL
ANION GAP: 6 (ref 5–15)
BUN: 12 mg/dL (ref 6–20)
CHLORIDE: 98 mmol/L — AB (ref 101–111)
CO2: 26 mmol/L (ref 22–32)
Calcium: 8.6 mg/dL — ABNORMAL LOW (ref 8.9–10.3)
Creatinine, Ser: 0.77 mg/dL (ref 0.44–1.00)
GFR calc non Af Amer: >60 mL/min (ref >60)
Glucose, Bld: 145 mg/dL — ABNORMAL HIGH (ref 65–99)
Potassium: 3.3 mmol/L — ABNORMAL LOW (ref 3.5–5.1)
Sodium: 130 mmol/L — ABNORMAL LOW (ref 135–145)

## 2015-04-12 NOTE — Clinical Social Work Note (Signed)
Clinical Social Worker presented bed offers to pt's son. Pt's [facility]. CSW confirmed this with facility. Pt's son inquired about assistance with ALF payment while pt is in rehab. CSW directly pt's son to [facility] (the ALF, where she lives) to address concerns. CSW will continue to follow.   Dede QuerySarah Nahla Lukin, MSW, LCSW Clinical Social Worker [phone]

## 2015-04-12 NOTE — Clinical Social Work Note (Signed)
Clinical Social Work Assessment  Patient Details  Name: Mercedes Powell MRN: [id number] Date of Birth: 05-12-1928  Date of referral:  04/12/15               Reason for consult:  Discharge Planning, [employer]                Permission sought to share information with:  Facility Medical sales representativeContact Representative, Family Supports, Case Estate manager/land agentManager Permission granted to share information::  Yes, Verbal Permission Granted  [facility]::     [facility]  Agency::  yes  Relationship::  Betha LoaVictor Jeffreys ( grandson) Armed forces technical officerVictor   Contact Information:  yes  Housing/Transportation Living arrangements for the past 2 months:  Skilled Building surveyorursing Facility Source of Information:  Patient Patient Interpreter Needed:  None Criminal Activity/Legal Involvement Pertinent to Current Situation/Hospitalization:   [place] Significant Relationships:   adult children and d grandson [name] Lives with:    other residents Do you feel safe going back to the place where you live?  No Need for family participation in patient care:  Yes (Comment)  Care giving concerns:  Pts family [facility]   Office managerocial Worker assessment / plan:  Complete assessment and Fl2 and [facility]  Employment status:  Retired Health and safety inspectornsurance information:    PT Recommendations:  Skilled Nursing Facility  Information / Referral to community resources:   Family well informed of services in SNF  Patient/Family's Response to care:  They understand her plan of care and support her relocation to another skilled nursing facility  Patient/Family's Understanding of and Emotional Response to Diagnosis, Current Treatment, and Prognosis:  Family provided with info and options  Emotional Assessment Appearance:  Appears stated age Attitude/Demeanor/Rapport:    Good demeanor Affect (typically observed):  Happy, Hopeful, Pleasant Orientation:  Oriented to Self, Oriented to Place, Oriented to  Time, Fluctuating  Orientation (Suspected and/or reported Sundowners) Alcohol / Substance use:  Never Used Psych involvement (Current and /or in the community):  No (Comment)  Discharge Needs  Concerns to be addressed:  No discharge needs identified Readmission within the last 30 days:  No Current discharge risk:  None Barriers to Discharge:  No Barriers Identified   Cheron SchaumannBandi, Masiah Lewing M, LCSW 04/12/2015, 11:30 AM

## 2015-04-12 NOTE — [place] FL2 (Signed)
[place] MEDICAID FL2 LEVEL OF CARE SCREENING TOOL     IDENTIFICATION  Patient Name: Mercedes Powell Birthdate: January 01, 1929 Sex: female Admission Date (Current Location): 04/10/2015  [place]   Facility and Address:  [facility], [address], [place], [place] [address]      Provider Number: [id number]  Attending Physician Name and Address:  Houston SirenVivek J Sainani, MD  Relative Name and Phone Number:       Current Level of Care: Hospital Recommended Level of Care: Skilled Nursing Facility Prior Approval Number:    Date Approved/Denied:   PASRR Number: [id number] [id number]  Discharge Plan: SNF    Current Diagnoses: Patient Active Problem List   Diagnosis Date Noted  . Hip fracture (HCC) 04/10/2015    Orientation RESPIRATION BLADDER Height & Weight    Self, Time, Situation, Place  O2 Continent 5\' 2"  (157.5 cm) 105 lbs.  BEHAVIORAL SYMPTOMS/MOOD NEUROLOGICAL BOWEL NUTRITION STATUS      Continent Diet (Regular Diet, Thin Liquids)  AMBULATORY STATUS COMMUNICATION OF NEEDS Skin   Extensive Assist (Mod assist, 1-2 assist) Verbally Normal                       Personal Care Assistance Level of Assistance  Bathing, Feeding, Dressing Bathing Assistance: Limited assistance Feeding assistance: Limited assistance Dressing Assistance: Limited assistance     Functional Limitations Info  Sight, Hearing, Speech Sight Info: Adequate Hearing Info: Adequate Speech Info: Adequate    SPECIAL CARE FACTORS FREQUENCY  PT (By licensed PT), OT (By licensed OT)     PT Frequency: 5 OT Frequency: 5            Contractures      Additional Factors Info  Code Status, Allergies Code Status Info: Full Code Allergies Info: Allergies: Ace Inhibitors, Galantamine, Maxidex, Maxzide, Procardia, Zoloft           Current Medications (04/12/2015):  This is the current hospital active medication list Current Facility-Administered  Medications  Medication Dose Route Frequency Provider Last Rate Last Dose  . 0.9 %  sodium chloride infusion  75 mL/hr Intravenous Continuous Juanell FairlyKevin Krasinski, MD 75 mL/hr at 04/12/15 0359 75 mL/hr at 04/12/15 0359  . acetaminophen (TYLENOL) tablet 650 mg  650 mg Oral Q6H PRN Juanell FairlyKevin Krasinski, MD   650 mg at 04/12/15 0631   Or  . acetaminophen (TYLENOL) suppository 650 mg  650 mg Rectal Q6H PRN Juanell FairlyKevin Krasinski, MD      . alum & mag hydroxide-simeth (MAALOX/MYLANTA) 200-200-20 MG/5ML suspension 30 mL  30 mL Oral Q4H PRN Juanell FairlyKevin Krasinski, MD      . amLODipine (NORVASC) tablet 5 mg  5 mg Oral Once Auburn BilberryShreyang Patel, MD   5 mg at 04/10/15 1730  . bisacodyl (DULCOLAX) suppository 10 mg  10 mg Rectal Daily PRN Juanell FairlyKevin Krasinski, MD      . calcium carbonate (TUMS - dosed in mg elemental calcium) chewable tablet 500 mg  500 mg Oral Daily Auburn BilberryShreyang Patel, MD   500 mg at 04/12/15 1039  . cholecalciferol (VITAMIN D) tablet 2,000 Units  2,000 Units Oral Daily Auburn BilberryShreyang Patel, MD   2,000 Units at 04/12/15 1039  . diphenoxylate-atropine (LOMOTIL) 2.5-0.025 MG per tablet 1 tablet  1 tablet Oral TID PRN Auburn BilberryShreyang Patel, MD      . docusate sodium (COLACE) capsule 100 mg  100 mg Oral BID Juanell FairlyKevin Krasinski, MD   100 mg at 04/12/15 1039  . enoxaparin (LOVENOX)  injection 30 mg  30 mg Subcutaneous Q12H Juanell Fairly, MD   30 mg at 04/12/15 0630  . HYDROcodone-acetaminophen (NORCO/VICODIN) 5-325 MG per tablet 1-2 tablet  1-2 tablet Oral Q6H PRN Juanell Fairly, MD   1 tablet at 04/12/15 1120  . levothyroxine (SYNTHROID, LEVOTHROID) tablet 75 mcg  75 mcg Oral QAC breakfast Auburn Bilberry, MD   75 mcg at 04/12/15 1039  . magnesium citrate solution 1 Bottle  1 Bottle Oral Once PRN Juanell Fairly, MD      . menthol-cetylpyridinium (CEPACOL) lozenge 3 mg  1 lozenge Oral PRN Juanell Fairly, MD       Or  . phenol (CHLORASEPTIC) mouth spray 1 spray  1 spray Mouth/Throat PRN Juanell Fairly, MD      . morphine 2 MG/ML injection 2 mg  2  mg Intravenous Q2H PRN Juanell Fairly, MD      . ondansetron [facility]) tablet 4 mg  4 mg Oral Q6H PRN Juanell Fairly, MD       Or  . ondansetron [facility]) injection 4 mg  4 mg Intravenous Q6H PRN Juanell Fairly, MD      . PARoxetine (PAXIL) tablet 10 mg  10 mg Oral Daily Auburn Bilberry, MD   10 mg at 04/12/15 1039  . polyethylene glycol (MIRALAX / GLYCOLAX) packet 17 g  17 g Oral Daily PRN Juanell Fairly, MD   17 g at 04/12/15 1038  . potassium chloride SA (K-DUR,KLOR-CON) CR tablet 20 mEq  20 mEq Oral BID Houston Siren, MD   20 mEq at 04/12/15 1039     Discharge Medications: Please see discharge summary for a list of discharge medications.  Relevant Imaging Results:  Relevant Lab Results:   Additional Information    Dede Query, LCSW

## 2015-04-12 NOTE — Clinical Social Work Placement (Signed)
   CLINICAL SOCIAL WORK PLACEMENT  NOTE  Date:  04/12/2015  Patient Details  Name: Mercedes Powell MRN: [id number] Date of Birth: 01/26/1929  Clinical Social Work is seeking post-discharge placement for this patient at the Skilled  Nursing Facility level of care (*CSW will initial, date and re-position this form in  chart as items are completed):  Yes   Patient/family provided with [place] Clinical Social Work Department's list of facilities offering this level of care within the geographic area requested by the patient (or if unable, by the patient's family).  Yes   Patient/family informed of their freedom to choose among providers that offer the needed level of care, that participate in Medicare, Medicaid or managed care program needed by the patient, have an available bed and are willing to accept the patient.  Yes   Patient/family informed of [place]'s ownership interest in [facility] and [facility], as well as of the fact that they are under no obligation to receive care at these facilities.  PASRR submitted to EDS on 04/12/15     PASRR number received on 04/12/15     Existing PASRR number confirmed on       FL2 transmitted to all facilities in geographic area requested by pt/family on 04/12/15     FL2 transmitted to all facilities within larger geographic area on       Patient informed that his/her managed care company has contracts with or will negotiate with certain facilities, including the following:        Yes   Patient/family informed of bed offers received.  Patient [facility] bed at [facility]     Physician recommends and patient chooses bed at  [facility](SNF)    Patient to be transferred to   on  .  Patient to be transferred to facility by       Patient family notified on   of transfer.  Name of family member notified:        PHYSICIAN       Additional Comment:    _______________________________________________ Mercedes QuerySarah Everson Mott,  LCSW 04/12/2015, 2:31 PM

## 2015-04-12 NOTE — Progress Notes (Signed)
Subjective:  Postoperative day #1 status post right hip hemiarthroplasty. Patient is up out of bed to a chair. Family members are at the bedside. Patient reports pain as mild.    Objective:   VITALS:   Filed Vitals:   04/11/15 2030 04/12/15 0422 04/12/15 0810 04/12/15 1006  BP: 153/64 154/65 147/56   Pulse: 91 85 84   Temp: 99.9 F (37.7 C) 99.2 F (37.3 C) 98.7 F (37.1 C)   TempSrc: Oral Oral Oral   Resp: Height:      Weight:      SpO2: 100% 99% 99% 98%    PHYSICAL EXAM:  Right lower extremity:  Patient's right hip dressing is clean dry and intact. She haserythema or ecchymosis or swelling. Her thigh compartments are soft and compressible. She is palpable pedal pulses, intact sensation to light touch and intact motor function throughout the right lower extremity.   LABS  Results for orders placed or performed during the hospital encounter of 04/10/15 (from the past 24 hour(s))  CBC     Status: Abnormal   Collection Time: 04/12/15  5:25 AM  Result Value Ref Range   WBC 8.5 3.6 - 11.0 K/uL   RBC 3.43 (L) 3.80 - 5.20 MIL/uL   Hemoglobin 10.4 (L) 12.0 - 16.0 g/dL   HCT 16.1 (L) 09.6 - 04.5 %   MCV 90.0 80.0 - 100.0 fL   MCH 30.2 26.0 - 34.0 pg   MCHC 33.5 32.0 - 36.0 g/dL   RDW 40.9 81.1 - 91.4 %   Platelets 197 150 - 440 K/uL  Basic metabolic panel     Status: Abnormal   Collection Time: 04/12/15  5:25 AM  Result Value Ref Range   Sodium 130 (L) 135 - 145 mmol/L   Potassium 3.3 (L) 3.5 - 5.1 mmol/L   Chloride 98 (L) 101 - 111 mmol/L   CO2 26 22 - 32 mmol/L   Glucose, Bld 145 (H) 65 - 99 mg/dL   BUN 12 6 - 20 mg/dL   Creatinine, Ser 7.82 0.44 - 1.00 mg/dL   Calcium 8.6 (L) 8.9 - 10.3 mg/dL   GFR calc non Af Amer >60 >60 mL/min   GFR calc Af Amer >60 >60 mL/min   Anion gap 6 5 - 15    Dg Chest 1 View  04/10/2015  CLINICAL DATA:  Fall, hypertension, right hip pain EXAM: CHEST 1 VIEW COMPARISON:  02/27/2015 FINDINGS: Cardiomediastinal silhouette is  stable. No acute infiltrate or pleural effusion. No pulmonary edema. Bony thorax is unremarkable. IMPRESSION: No active disease. Electronically Signed   By: Natasha Mead M.D.   On: 04/10/2015 14:29   Dg Hip Port Unilat With Pelvis 1v Right  04/11/2015  CLINICAL DATA:  Right hip fracture. EXAM: DG HIP (WITH OR WITHOUT PELVIS) 1V PORT RIGHT COMPARISON:  Radiographs dated 04/10/2015 FINDINGS: AP and lateral views demonstrate the patient has undergone right hip hemiarthroplasty. The prosthesis appears in excellent position in the AP and lateral projection. No appreciable fractures. Postsurgical air in the soft tissues as expected. IMPRESSION: Satisfactory appearance of the right hip after hemiarthroplasty. Electronically Signed   By: Francene Boyers M.D.   On: 04/11/2015 14:19   Dg Hip Unilat  With Pelvis 2-3 Views Right  04/10/2015  CLINICAL DATA:  Fall, right hip pain EXAM: DG HIP (WITH OR WITHOUT PELVIS) 2-3V RIGHT COMPARISON:  None. FINDINGS: Three views of the right hip submitted. Diffuse osteopenia. There is displaced fracture of  the right femoral neck. Degenerative changes pubic symphysis. IMPRESSION: Diffuse osteopenia.  Displaced fracture of the right femoral neck. Electronically Signed   By: Natasha MeadLiviu  Pop M.D.   On: 04/10/2015 14:26    Assessment/Plan: 1 Day Post-Op   Active Problems:   Hip fracture [facility](HCC)  Patient doing well postop. Her Foley catheter is out. She will continue physical therapy. Patient will continue to observe posterior hip precautions. Encourage incentive spirometry while awake. I reviewed the lab work. The patient remains hypokalemic and other labs are within normal limits. She will start Lovenox for DVT prophylaxis today.    Juanell FairlyKRASINSKI, Yorel Redder , MD 04/12/2015, 10:07 AM

## 2015-04-12 NOTE — Clinical Social Work Placement (Signed)
   CLINICAL SOCIAL WORK PLACEMENT  NOTE  Date:  04/12/2015  Patient Details  Name: Mercedes Powell MRN: [id number] Date of Birth: [birth year]  Clinical Social Work is seeking post-discharge placement for this patient at the Skilled  Nursing Facility level of care (*CSW will initial, date and re-position this form in  chart as items are completed):  Yes   Patient/family provided with [place] Clinical Social Work Department's list of facilities offering this level of care within the geographic area requested by the patient (or if unable, by the patient's family).  Yes   Patient/family informed of their freedom to choose among providers that offer the needed level of care, that participate in Medicare, Medicaid or managed care program needed by the patient, have an available bed and are willing to accept the patient.  Yes   Patient/family informed of [place]'s ownership interest in [facility] and [facility], as well as of the fact that they are under no obligation to receive care at these facilities.  PASRR submitted to EDS on 04/12/15     PASRR number received on 04/12/15     Existing PASRR number confirmed on       FL2 transmitted to all facilities in geographic area requested by pt/family on 04/12/15     FL2 transmitted to all facilities within larger geographic area on       Patient informed that his/her managed care company has contracts with or will negotiate with certain facilities, including the following:            Patient/family informed of bed offers received.  Patient chooses bed at       Physician recommends and patient chooses bed at      Patient to be transferred to   on  .  Patient to be transferred to facility by       Patient family notified on   of transfer.  Name of family member notified:        PHYSICIAN       Additional Comment:    _______________________________________________ Dede QuerySarah Kandra Graven, LCSW 04/12/2015, 11:44 AM

## 2015-04-12 NOTE — Evaluation (Signed)
Physical Therapy Evaluation Patient Details Name: Mercedes Powell MRN: [id number] DOB: 1928-11-01 Today's Date: 04/12/2015   History of Present Illness  presented to ER status post fall at ALF; admitted with displaced fracture of R femoral neck, status post R hemiarthroplasty (12/20), PWB with posterior THPs.  Clinical Impression  Upon evaluation, patient alert and oriented to self only; follows simple, one step commands with increased time for processing.  Maintains R LE in very guarded position due to pain, but demonstrates strength at least 2 to 3-/5 with fair tolerance for act assist ROM (within THPs).  Currently requiring max assist for bed mobility; min/mod assist for unsupported sitting balance; mod assist +1-2 with RW for sit/stand, basic transfers and very short-distance gait (5).  Patient very tremulous and fearful with mobility efforts; second person primarily for patient comfort. Anticipate significant difficulty with comprehension and recall of THPs and WBing restrictions due to cognitive deficits; will need constant cuing/assist to maintain. Would benefit from skilled PT to address above deficits and promote optimal return to PLOF; recommend transition to STR upon discharge from acute hospitalization.     Follow Up Recommendations SNF    Equipment Recommendations       Recommendations for Other Services       Precautions / Restrictions Precautions Precautions: Fall;Posterior Hip Restrictions Weight Bearing Restrictions: Yes RLE Weight Bearing: Partial weight bearing      Mobility  Bed Mobility Overal bed mobility: Needs Assistance Bed Mobility: Supine to Sit     Supine to sit: Max assist     General bed mobility comments: very guarded due to pain in R LE  Transfers Overall transfer level: Needs assistance Equipment used: Rolling walker (2 wheeled) Transfers: Sit to/from Stand Sit to Stand: Mod assist         General transfer comment: cuing for hand and  foot placement  Ambulation/Gait Ambulation/Gait assistance: Mod assist;+2 physical assistance Ambulation Distance (Feet): 5 Feet Assistive device: Rolling walker (2 wheeled)       General Gait Details: step to gait pattern with decreased stance time R LE.  Patient very fearful and tremulous with all gait efforts; mod assist for walker management to facilitate progression of transfer.  Anticipate poor ability to comprehend and maintain PWB R LE for longer-distance mobility  Stairs            Wheelchair Mobility    Modified Rankin (Stroke Patients Only)       Balance Overall balance assessment: Needs assistance Sitting-balance support: No upper extremity supported;Feet supported Sitting balance-Leahy Scale: Fair   Postural control: Posterior lean;Right lateral lean Standing balance support: Bilateral upper extremity supported Standing balance-Leahy Scale: Poor                               Pertinent Vitals/Pain Pain Assessment: Faces Faces Pain Scale: Hurts even more Pain Location: R hip Pain Descriptors / Indicators: Aching;Grimacing Pain Intervention(s): Limited activity within patient's tolerance;Monitored during session;Repositioned    Home Living Family/patient expects to be discharged to:: Assisted living                 Additional Comments: patient unable to provide information, grandson unsure; will verify with additional family as appropriate    Prior Function Level of Independence: Independent         Comments: Indep with household activities, assist with ADLs as needed. Per chart, history of recurrent falls.     Hand Dominance  Extremity/Trunk Assessment   Upper Extremity Assessment: Overall WFL for tasks assessed           Lower Extremity Assessment:  (R hip grossly 2 to 3-/5, limited by pain; otherwise, LEs appear grossly [facility])         Communication   Communication: No difficulties  Cognition  Arousal/Alertness: Awake/alert Behavior During Therapy: WFL for tasks assessed/performed Overall Cognitive Status: History of cognitive impairments - at baseline (oriented to self only; generally confused with impaired processing)       Memory: Decreased short-term memory              General Comments General comments (skin integrity, edema, etc.): R hip incision with honeycomb dressing, mild sanguinous drainage noted    Exercises Other Exercises Other Exercises: Act assist ROM to R LE, 1x10, for strength/flexibility with functional activities: ankle pumps, SAQs, heel slides, hip abduct/adduct.      Assessment/Plan    PT Assessment Patient needs continued PT services  PT Diagnosis Difficulty walking;Acute pain;Generalized weakness   PT Problem List Decreased strength;Decreased range of motion;Decreased activity tolerance;Decreased balance;Decreased mobility;Decreased knowledge of use of DME;Decreased safety awareness;Decreased knowledge of precautions;Pain;Decreased skin integrity  PT Treatment Interventions DME instruction;Gait training;Stair training;Functional mobility training;Therapeutic activities;Therapeutic exercise;Patient/family education;Balance training   PT Goals (Current goals can be found in the Care Plan section) Acute Rehab PT Goals PT Goal Formulation: Patient unable to participate in goal setting Time For Goal Achievement: 04/26/15 Potential to Achieve Goals: Fair    Frequency BID   Barriers to discharge Decreased caregiver support      Co-evaluation               End of Session Equipment Utilized During Treatment: Gait belt Activity Tolerance: Patient tolerated treatment well Patient left: in chair;with call bell/phone within reach;with chair alarm set;with family/visitor present Nurse Communication: Mobility status (O2 response to activity, on RA end of session)         Time: 5621-30860848-0911 PT Time Calculation (min) (ACUTE ONLY): 23  min   Charges:   PT Evaluation $Initial PT Evaluation Tier I: 1 Procedure PT Treatments $Therapeutic Exercise: 8-22 mins   PT G Codes:       Mercedes Powell, PT, DPT, NCS 04/12/2015, 10:14 AM [phone]

## 2015-04-12 NOTE — Progress Notes (Signed)
[facility] - [place] at [facility]   PATIENT NAME: Mercedes Powell    MR#:  [id number]  DATE OF BIRTH:  1928-09-29  SUBJECTIVE:  CHIEF COMPLAINT:   Chief Complaint  Patient presents with  . Hip Pain   Status post right hip replacement. Postop day #1. Sitting up in chair and family at bedside. Pain is controlled. Foley catheter is out.  REVIEW OF SYSTEMS:    Review of Systems  Constitutional: Negative for fever and chills.  HENT: Negative for congestion and tinnitus.   Eyes: Negative for blurred vision and double vision.  Respiratory: Negative for cough, shortness of breath and wheezing.   Cardiovascular: Negative for chest pain, orthopnea and PND.  Gastrointestinal: Negative for nausea, vomiting, abdominal pain and diarrhea.  Genitourinary: Negative for dysuria and hematuria.  Musculoskeletal: Positive for joint pain (right hip. ).  Neurological: Negative for dizziness, sensory change and focal weakness.  All other systems reviewed and are negative.  Nutrition: Regular diet Tolerating Diet: Yes Tolerating PT: Eval noted.    DRUG ALLERGIES:   Allergies  Allergen Reactions  . Ace Inhibitors Swelling  . Galantamine Diarrhea  . Maxidex [Dexamethasone] Other (See Comments)    Reaction:  Unknown   . Maxzide [Hydrochlorothiazide W-Triamterene] Other (See Comments)    Reaction:  Hyponatremia   . Procardia [Nifedipine] Swelling  . Zoloft [Sertraline Hcl] Diarrhea    VITALS:  Blood pressure 147/56, pulse 84, temperature 98.7 F (37.1 C), temperature source Oral, resp. rate 16, height  (1.575 m), weight 47.628 kg (105 lb), SpO2 98 %.  PHYSICAL EXAMINATION:   Physical Exam  GENERAL:  81 y.o.-year-old patient sitting up in chair in no apparent distress. EYES: Pupils equal, round, reactive to light. No scleral icterus. Extraocular muscles intact.  HEENT: Head atraumatic, normocephalic. Oropharynx and nasopharynx clear.  NECK:  Supple, no  jugular venous distention. No thyroid enlargement, no tenderness.  LUNGS: Normal breath sounds bilaterally, no wheezing, rales, rhonchi. No use of accessory muscles of respiration.  CARDIOVASCULAR: S1, S2 normal. No murmurs, rubs, or gallops.  ABDOMEN: Soft, nontender, nondistended. Bowel sounds present. No organomegaly or mass.  EXTREMITIES: No cyanosis, clubbing or edema b/l. Right hip dressing from surgery    NEUROLOGIC: Cranial nerves II through XII are intact. No focal Motor or sensory deficits b/l.  Globally weak.  PSYCHIATRIC: The patient is alert and oriented x 2. Good affect SKIN: No obvious rash, lesion, or ulcer.    LABORATORY PANEL:   CBC  Recent Labs Lab 04/12/15 0525  WBC 8.5  HGB 10.4*  HCT 30.9*  PLT 197   ------------------------------------------------------------------------------------------------------------------  Chemistries   Recent Labs Lab 04/12/15 0525  NA 130*  K 3.3*  CL 98*  CO2 26  GLUCOSE 145*  BUN 12  CREATININE 0.77  CALCIUM 8.6*   ------------------------------------------------------------------------------------------------------------------  Cardiac Enzymes  Recent Labs Lab 04/10/15 1332  TROPONINI <0.03   ------------------------------------------------------------------------------------------------------------------  RADIOLOGY:  Dg Hip Port Unilat With Pelvis 1v Right  04/11/2015  CLINICAL DATA:  Right hip fracture. EXAM: DG HIP (WITH OR WITHOUT PELVIS) 1V PORT RIGHT COMPARISON:  Radiographs dated 04/10/2015 FINDINGS: AP and lateral views demonstrate the patient has undergone right hip hemiarthroplasty. The prosthesis appears in excellent position in the AP and lateral projection. No appreciable fractures. Postsurgical air in the soft tissues as expected. IMPRESSION: Satisfactory appearance of the right hip after hemiarthroplasty. Electronically Signed   By: Francene Boyers M.D.   On: 04/11/2015 14:19     ASSESSMENT  AND  PLAN:   81 year old female with past medical history of hypertension, anxiety, hypothyroidism, Raynaud's phenomenon, dementia who presented to the hospital due to mechanical fall and noted to have right hip fracture.  #1 status post fall and right hip fracture-status post ORIF postop day #1 -Continue pain control and care as per orthopedics. - cont.  PT as tolerated. Foley cath is out.   #2 hypertension-continue Norvasc  #3 anxiety-continue Paxil.  #4 Hypothyroidism-continue Synthroid.  #5 Hypokalemia - cont. Oral supplements and repeat level in a.m.  - check Mg. Level.  #6 Hyponatremia - mild and cont. IV fluids and will monitor.   Likely d/c to SNF in 2 days.   All the records are reviewed and case discussed with Care Management/Social Workerr. Management plans discussed with the patient, family and they are in agreement.  CODE STATUS: Full   DVT Prophylaxis: Lovenox  TOTAL TIME TAKING CARE OF THIS PATIENT: 25 minutes.   POSSIBLE D/C IN 2-3 DAYS, DEPENDING ON CLINICAL CONDITION.   [place] SirenSAINANI,VIVEK J M.D on 04/12/2015 at 2:28 PM  Between 7am to 6pm - Pager - [phone]  After 6pm go to [email or web address] - password [email or web address] [facility]  [facility]  Office  [phone]  CC: Primary care physician; No primary care provider on file.

## 2015-04-12 NOTE — Progress Notes (Signed)
Physical Therapy Treatment Patient Details Name: Mercedes EdwardsBetty J Powell MRN: [id number] DOB: [birth year] Today's Date: 04/12/2015    History of Present Illness This 81 year old [facility] resulting in a R femoral neck fracture. Patient recieved a hemiarthroplasty repair.     PT Comments    Pt back in bed this afternoon. Agreeable to bed exercises. Pt requires active assist for exercises with mild increased instruction at times. Guarded, limited range on right lower extremity with some complains of increased pain. Continue PT for progression of active assisted range of motion, active range of motion and strength to facilitate improved functional mobility.   Follow Up Recommendations  SNF     Equipment Recommendations       Recommendations for Other Services       Precautions / Restrictions Precautions Precautions: Fall;Posterior Hip Restrictions Weight Bearing Restrictions: Yes RLE Weight Bearing: Partial weight bearing    Mobility  Bed Mobility                  Transfers                    Ambulation/Gait                 Stairs            Wheelchair Mobility    Modified Rankin (Stroke Patients Only)       Balance                                    Cognition Arousal/Alertness: Awake/alert Behavior During Therapy: WFL for tasks assessed/performed Overall Cognitive Status: History of cognitive impairments - at baseline       Memory: Decreased short-term memory              Exercises Total Joint Exercises Ankle Circles/Pumps: AROM;Both;20 reps;Supine Quad Sets: Strengthening;Both;10 reps;Supine Gluteal Sets:  (attempted; unable to comprehend) Towel Squeeze: Strengthening;Both;10 reps;Supine (pillow) Short Arc Quad: AROM;AAROM;Both;10 reps;Supine (increased instruction to comprehend) Heel Slides: AAROM;Both;10 reps;Supine (limited/guarded range on R) Hip ABduction/ADduction: AAROM;Both;10  reps;Supine (limited range R) Straight Leg Raises: AAROM;Both;10 reps;Supine;PROM (PROM on R)    General Comments        Pertinent Vitals/Pain Pain Assessment:  (states pain is "medium")    Home Living                      Prior Function            PT Goals (current goals can now be found in the care plan section)      Frequency  BID    PT Plan Current plan remains appropriate    Co-evaluation             End of Session   Activity Tolerance: Patient limited by pain Patient left: in bed;with call bell/phone within reach;with bed alarm set;with SCD's reapplied     Time: 1434-1453 PT Time Calculation (min) (ACUTE ONLY): 19 min  Charges:  $Therapeutic Exercise: 8-22 mins                    G Codes:      Kristeen MissHeidi Elizabeth Bishop 04/12/2015, 3:08 PM

## 2015-04-12 NOTE — Evaluation (Signed)
Occupational Therapy Evaluation Patient Details Name: Mercedes Powell MRN: [id number] DOB: [birth year] Today's Date: 04/12/2015    History of Present Illness This 81 year old female came to [facility] after a fall resulting in a R femoral neck fracture. Patient recieved a hemiarthroplasty repair.    Clinical Impression   This patient is an 81 year old female who came to [facility] after a fall suffering a R hip fracture. She received a hemiarthroplasty repair. She lives in assisted living. Yolanda Bonine thinks she was getting only minimal help but is not sure. She would benefit from Occupational Therapy for ADL/functional mobility training while .staying within hip precautions and family/caregiver teaching secondary to cognition.     Follow Up Recommendations  SNF    Equipment Recommendations       Recommendations for Other Services       Precautions / Restrictions Precautions Precautions: Fall;Posterior Hip Restrictions Weight Bearing Restrictions: Yes RLE Weight Bearing: Partial weight bearing      Mobility Bed Mobility  Transfers    Balance                              ADL                                         General ADL Comments: Patient needing hand over hand assist for lower body dressing using hip kit to stay within hip precautions (posterior approach).  Required constant verbal and physical cues for technique and safety.  Taught patient and grandson hip precautions before, during and after dressing activity. Patient unable to name any at the end, but grandson could.      Vision     Perception     Praxis      Pertinent Vitals/Pain Pain Assessment: Faces Pain Score:  (Stated that she had no pain (when at rest) but did react when moved )   Pain Descriptors / Indicators: Grimacing when moved. Pain Intervention(s): Nurse in room giving medication at the end of session.     Hand Dominance Right    Extremity/Trunk Assessment Upper Extremity Assessment Upper Extremity Assessment:  (B UE 4-/5 through out)   Lower Extremity Assessment see Physical Therapy         Communication Communication Communication:  (Patient, at times, would not answer a question)   Cognition Arousal/Alertness: Awake/alert Behavior During Therapy:  (confused) Overall Cognitive Status: History of cognitive impairments - at baseline       Memory: Decreased short-term memory             General Comments       Exercises     Shoulder Instructions      Home Living Family/patient expects to be discharged to:: [facility].                                 Additional Comments: patient unable to provide information, grandson unsure; will verify with additional family as appropriate      Prior Functioning/Environment Level of Independence: Independent        Comments: grandson not sure but thinks assistance had been minimal.    OT Diagnosis: Acute pain   OT Problem List: Decreased activity tolerance;Impaired balance (sitting and/or standing);Decreased safety awareness;Decreased cognition;Decreased knowledge of use of DME  or AE;Decreased knowledge of precautions;Pain   OT Treatment/Interventions: Self-care/ADL training    OT Goals(Current goals can be found in the care plan section) Acute Rehab OT Goals Patient Stated Goal: To go to [facility] (grandson) OT Goal Formulation: With patient/family Time For Goal Achievement: 04/26/15 Potential to Achieve Goals: Good  OT Frequency: Min 1X/week   Barriers to D/C:            Co-evaluation              End of Session Equipment Utilized During Treatment:  (hip kit)  Activity Tolerance:   Patient left: in chair;with call bell/phone within reach;with chair alarm set;with family/visitor present;with nursing/sitter in room   Time: 1025-1045 OT Time Calculation (min): 20 min Charges:  OT General Charges $OT Visit: 1  Procedure OT Evaluation $Initial OT Evaluation Tier I: 1 Procedure OT Treatments $Self Care/Home Management : 8-22 mins G-Codes:    Myrene Galas, MS/OTR/L  04/12/2015, 11:01 AM

## 2015-04-12 NOTE — Anesthesia Postprocedure Evaluation (Signed)
Anesthesia Post Note  [name]  Procedure(s) Performed: Procedure(s) (LRB): ARTHROPLASTY BIPOLAR HIP (HEMIARTHROPLASTY) (Right)  Patient location during evaluation: Nursing Unit (floor) Anesthesia Type: Spinal Level of consciousness: awake and alert Pain management: pain level controlled Vital Signs Assessment: post-procedure vital signs reviewed and stable Respiratory status: spontaneous breathing and respiratory function stable Cardiovascular status: stable Postop Assessment: no headache and no backache Anesthetic complications: no    Last Vitals:  Filed Vitals:   04/12/15 0422 04/12/15 0810  BP: 154/65 147/56  Pulse: 85 84  Temp: 37.3 C 37.1 C  Resp: 18 16    Last Pain:  Filed Vitals:   04/12/15 1006  PainSc: Asleep                 Clydene PughBeane, Garlan Drewes D

## 2015-04-13 ENCOUNTER — Encounter
Admission: RE | Admit: 2015-04-13 | Discharge: 2015-04-13 | Disposition: A | Discharge status: Home / Self Care | Payer: Medicare Other | Source: Ambulatory Visit | Attending: Internal Medicine | Admitting: Internal Medicine

## 2015-04-13 LAB — BASIC METABOLIC PANEL
Anion gap: 8 (ref 5–15)
BUN: 13 mg/dL (ref 6–20)
CHLORIDE: 99 mmol/L — AB (ref 101–111)
CO2: 25 mmol/L (ref 22–32)
CREATININE: 0.85 mg/dL (ref 0.44–1.00)
Calcium: 8.9 mg/dL (ref 8.9–10.3)
Glucose, Bld: 143 mg/dL — ABNORMAL HIGH (ref 65–99)
POTASSIUM: 3.7 mmol/L (ref 3.5–5.1)
SODIUM: 132 mmol/L — AB (ref 135–145)

## 2015-04-13 LAB — CBC
HCT: 28.3 % — ABNORMAL LOW (ref 35.0–47.0)
HEMOGLOBIN: 9.6 g/dL — AB (ref 12.0–16.0)
MCH: 30.4 pg (ref 26.0–34.0)
MCHC: 33.7 g/dL (ref 32.0–36.0)
MCV: 90.1 fL (ref 80.0–100.0)
PLATELETS: 180 10*3/uL (ref 150–440)
RBC: 3.14 MIL/uL — AB (ref 3.80–5.20)
RDW: 12.5 % (ref 11.5–14.5)
WBC: 9.2 10*3/uL (ref 3.6–11.0)

## 2015-04-13 LAB — SURGICAL PATHOLOGY

## 2015-04-13 MED ORDER — ENOXAPARIN SODIUM 30 MG/0.3ML ~~LOC~~ SOLN: 30.0000 mg | SUBCUTANEOUS | Status: DC

## 2015-04-13 MED ORDER — FLEET ENEMA 7-19 GM/118ML RE ENEM
1.0000 | ENEMA | Freq: Every day | RECTAL | Status: DC | PRN
Start: 2015-04-13 — End: 2015-04-13
  Administered 2015-04-13: 1 via RECTAL
  Filled 2015-04-13: qty 1

## 2015-04-13 MED ORDER — LACTULOSE 10 GM/15ML PO SOLN
30.0000 g | Freq: Every day | ORAL | Status: DC | PRN
  Administered 2015-04-13: 30 g via ORAL
  Filled 2015-04-13: qty 60

## 2015-04-13 MED ORDER — HYDROCODONE-ACETAMINOPHEN 5-325 MG PO TABS: 1.0000 | ORAL_TABLET | Freq: Four times a day (QID) | ORAL | Status: DC | PRN

## 2015-04-13 MED ORDER — BISACODYL 10 MG RE SUPP
10.0000 mg | Freq: Once | RECTAL | Status: AC
  Administered 2015-04-13: 10 mg via RECTAL
  Filled 2015-04-13: qty 1

## 2015-04-13 MED ORDER — ENOXAPARIN SODIUM 40 MG/0.4ML ~~LOC~~ SOLN: 30.0000 mg | SUBCUTANEOUS | Status: DC

## 2015-04-13 NOTE — Progress Notes (Signed)
No BM since 12/18.  Orders given for suppository and fleets

## 2015-04-13 NOTE — Progress Notes (Signed)
EMS is here to pick up the pt.  IV's removed

## 2015-04-13 NOTE — Care Management (Signed)
Lovenox order needed from ortho. RN notified.

## 2015-04-13 NOTE — Progress Notes (Signed)
Report was called to nurse Talbert ForestShirley at [facility].  EMS called

## 2015-04-13 NOTE — Care Management Important Message (Signed)
Important Message  Patient Details  Name: Mercedes Powell MRN: [id number] Date of Birth: October 05, 1928   Medicare Important Message Given:  Yes    Olegario MessierKathy A Jonathan Kirkendoll 04/13/2015, 10:06 AM

## 2015-04-13 NOTE — Discharge Summary (Signed)
[facility] [place] at [facility]   PATIENT NAME: Mercedes Powell    MR#:  [id number]  DATE OF BIRTH:  1928-09-17  DATE OF ADMISSION:  04/10/2015 ADMITTING PHYSICIAN: Auburn Bilberry, MD  DATE OF DISCHARGE: 04/13/2015  PRIMARY CARE PHYSICIAN: No primary care provider on file.    ADMISSION DIAGNOSIS:  Hip fracture, right, closed, initial encounter (HCC) [S72.001A]  DISCHARGE DIAGNOSIS:  Active Problems:   Hip fracture (HCC)   SECONDARY DIAGNOSIS:   Past Medical History  Diagnosis Date  . Hypertension   . Anxiety   . Chronic leg pain   . HTN (hypertension)   . Hypothyroid   . Raynaud phenomenon   . Dementia     HOSPITAL COURSE:   81 year old female with past medical history of hypertension, anxiety, hypothyroidism, Raynaud's phenomenon, dementia who presented to the hospital due to mechanical fall and noted to have right hip fracture.  #1 status post fall and right hip fracture-she was admitted to the hospital and orthopedic consult was obtained. -Patient is status post open reduction internal fixation of the right hip. She is postop day #2. Her pain is well controlled. She is tolerating physical therapy well. She is being discharged to skilled nursing facility for ongoing rehabilitation. She will use Lovenox for DVT prophylaxis daily until 05/02/2015. -She will follow-up with orthopedics in the next 1-2 weeks.  #2 hypertension-patient remained hemodynamically stable and will continue Norvasc, HCTZ  #3 anxiety-patient will continue Paxil.  #4 Hypothyroidism-patient will continue Synthroid.  #5 Hypokalemia - this has been supplemented now resolved.  #6 Hyponatremia - also improved and resolved with IV fluid hydration.  She is being discharged to a skilled nursing facility for ongoing care.   DISCHARGE CONDITIONS:   Stable  CONSULTS OBTAINED:     DRUG ALLERGIES:   Allergies  Allergen Reactions  . Ace Inhibitors Swelling  .  Galantamine Diarrhea  . Maxidex [Dexamethasone] Other (See Comments)    Reaction:  Unknown   . Maxzide [Hydrochlorothiazide W-Triamterene] Other (See Comments)    Reaction:  Hyponatremia   . Procardia [Nifedipine] Swelling  . Zoloft [Sertraline Hcl] Diarrhea    DISCHARGE MEDICATIONS:   Current Discharge Medication List    START taking these medications   Details  enoxaparin (LOVENOX) 30 MG/0.3ML injection Inject 0.3 mLs (30 mg total) into the skin daily. Qty: 0 Syringe    HYDROcodone-acetaminophen (NORCO/VICODIN) 5-325 MG tablet Take 1-2 tablets by mouth every 6 (six) hours as needed for moderate pain. Qty: 30 tablet, Refills: 0      CONTINUE these medications which have NOT CHANGED   Details  amLODipine (NORVASC) 5 MG tablet Take 1 tablet (5 mg total) by mouth once. Qty: 15 tablet, Refills: 0    Calcium 500 MG CHEW Chew 500 mg by mouth daily.    Cholecalciferol (VITAMIN D) 2000 UNITS CAPS Take 2,000 Units by mouth daily.    diphenoxylate-atropine (LOMOTIL) 2.5-0.025 MG tablet Take 1 tablet by mouth 3 (three) times daily as needed for diarrhea or loose stools.    hydrochlorothiazide (HYDRODIURIL) 12.5 MG tablet Take 12.5 mg by mouth daily.    levothyroxine (SYNTHROID, LEVOTHROID) 75 MCG tablet Take 75 mcg by mouth daily before breakfast.    Multiple Vitamin (MULTIVITAMIN WITH MINERALS) TABS tablet Take 1 tablet by mouth daily.    PARoxetine (PAXIL) 10 MG tablet Take 10 mg by mouth daily.    Polyethyl Glycol-Propyl Glycol (SYSTANE) 0.4-0.3 % GEL ophthalmic gel Place 1 application into both eyes as  needed (for dry eyes).    polyvinyl alcohol (LIQUIFILM TEARS) 1.4 % ophthalmic solution Place 1 drop into both eyes 4 (four) times daily.         DISCHARGE INSTRUCTIONS:   DIET:  Cardiac diet  DISCHARGE CONDITION:  Stable  ACTIVITY:  Activity as tolerated  OXYGEN:  Home Oxygen: No.   Oxygen Delivery: room air  DISCHARGE LOCATION:  nursing home   If you  experience worsening of your admission symptoms, develop shortness of breath, life threatening emergency, suicidal or homicidal thoughts you must seek medical attention immediately by calling 911 or calling your MD immediately  if symptoms less severe.  You Must read complete instructions/literature along with all the possible adverse reactions/side effects for all the Medicines you take and that have been prescribed to you. Take any new Medicines after you have completely understood and accpet all the possible adverse reactions/side effects.   Please note  You were cared for by a hospitalist during your hospital stay. If you have any questions about your discharge medications or the care you received while you were in the hospital after you are discharged, you can call the unit and asked to speak with the hospitalist on call if the hospitalist that took care of you is not available. Once you are discharged, your primary care physician will handle any further medical issues. Please note that NO REFILLS for any discharge medications will be authorized once you are discharged, as it is imperative that you return to your primary care physician (or establish a relationship with a primary care physician if you do not have one) for your aftercare needs so that they can reassess your need for medications and monitor your lab values.     Today   Patient is postop day #2 from right hip replacement. Pain is well controlled. No BM in 2 days and will give Dulcolax suppository and an enema if needed.  VITAL SIGNS:  Blood pressure 124/59, pulse 90, temperature 99 F (37.2 C), temperature source Oral, resp. rate 18, height  (1.575 m), weight 47.628 kg (105 lb), SpO2 95 %.  I/O:   Intake/Output Summary (Last 24 hours) at 04/13/15 1243 Last data filed at 04/13/15 1000  Gross per 24 hour  Intake 243.75 ml  Output      0 ml  Net 243.75 ml    PHYSICAL EXAMINATION:   GENERAL: 81 y.o.-year-old patient  sitting up in chair in no apparent distress. EYES: Pupils equal, round, reactive to light. No scleral icterus. Extraocular muscles intact.  HEENT: Head atraumatic, normocephalic. Oropharynx and nasopharynx clear.  NECK: Supple, no jugular venous distention. No thyroid enlargement, no tenderness.  LUNGS: Normal breath sounds bilaterally, no wheezing, rales, rhonchi. No use of accessory muscles of respiration.  CARDIOVASCULAR: S1, S2 normal. No murmurs, rubs, or gallops.  ABDOMEN: Soft, nontender, nondistended. Bowel sounds present. No organomegaly or mass.  EXTREMITIES: No cyanosis, clubbing or edema b/l. Right hip dressing from surgery  NEUROLOGIC: Cranial nerves II through XII are intact. No focal Motor or sensory deficits b/l. Globally weak.  PSYCHIATRIC: The patient is alert and oriented x 2. Good affect SKIN: No obvious rash, lesion, or ulcer.   DATA REVIEW:   CBC  Recent Labs Lab 04/13/15 0538  WBC 9.2  HGB 9.6*  HCT 28.3*  PLT 180    Chemistries   Recent Labs Lab 04/13/15 0538  NA 132*  K 3.7  CL 99*  CO2 25  GLUCOSE 143*  BUN 13  CREATININE 0.85  CALCIUM 8.9    Cardiac Enzymes  Recent Labs Lab 04/10/15 1332  TROPONINI <0.03    Microbiology Results  Results for orders placed or performed during the hospital encounter of 04/10/15  MRSA PCR Screening     Status: None   Collection Time: 04/10/15  6:30 PM  Result Value Ref Range Status   MRSA by PCR NEGATIVE NEGATIVE Final    Comment:        The GeneXpert MRSA Assay (FDA approved for NASAL specimens only), is one component of a comprehensive MRSA colonization surveillance program. It is not intended to diagnose MRSA infection nor to guide or monitor treatment for MRSA infections.     RADIOLOGY:  Dg Hip Port Unilat With Pelvis 1v Right  04/11/2015  CLINICAL DATA:  Right hip fracture. EXAM: DG HIP (WITH OR WITHOUT PELVIS) 1V PORT RIGHT COMPARISON:  Radiographs dated 04/10/2015  FINDINGS: AP and lateral views demonstrate the patient has undergone right hip hemiarthroplasty. The prosthesis appears in excellent position in the AP and lateral projection. No appreciable fractures. Postsurgical air in the soft tissues as expected. IMPRESSION: Satisfactory appearance of the right hip after hemiarthroplasty. Electronically Signed   By: Francene BoyersJames  Maxwell M.D.   On: 04/11/2015 14:19      Management plans discussed with the patient, family and they are in agreement.  CODE STATUS:     Code Status Orders        Start     Ordered   04/11/15 1519  Full code   Continuous     04/11/15 1518      TOTAL TIME TAKING CARE OF THIS PATIENT: 40 minutes.    Houston SirenSAINANI,VIVEK J M.D on 04/13/2015 at 12:43 PM  Between 7am to 6pm - Pager - [phone]  After 6pm go to [email or web address] - password [email or web address] [facility]  [facility]  Office  [phone]  CC: Primary care physician; No primary care provider on file.

## 2015-04-13 NOTE — Progress Notes (Addendum)
Physical Therapy Treatment Patient Details Name: Mercedes Powell MRN: [id number] DOB: 1928/12/02 Today's Date: 04/13/2015    History of Present Illness This 81 year old female came to [facility] after a fall resulting in a R femoral neck fracture. Patient recieved a hemiarthroplasty repair.     PT Comments    Pt has mildly increased difficulty following instruction with exercises today. Max of 2 for bed mobility; pt pushes into extension and holds guarded pattern. Once seated, pt able to maintain seated position with hands on rolling walker and right lower extremity extended to adhere to posterior precautions. Pt requires Mod x 2 for stand and Min A x 2 for short ambulation bed to chair. Pt demonstrates ability to take small steps and fairly compliant with R PWB due to pain and limited ability to bear weight on right lower extremity. Mildly unsteady. Pt comfortable up in chair and encouraged pt/family to tolerate sitting for a couple hours if possible. Plan to see pt this afternoon if not discharged for continued work on strengthening and range of motion to improve overall functional mobility.   Follow Up Recommendations  SNF     Equipment Recommendations       Recommendations for Other Services       Precautions / Restrictions Precautions Precautions: Fall;Posterior Hip Restrictions Weight Bearing Restrictions: Yes RLE Weight Bearing: Partial weight bearing RLE Partial Weight Bearing Percentage or Pounds: 50    Mobility  Bed Mobility Overal bed mobility: Needs Assistance Bed Mobility: Supine to Sit     Supine to sit: Max assist;+2 for physical assistance     General bed mobility comments: guarded; pushes into extension; once seated edge of bed able to maintain with BUE support on rw and Min guard  Transfers Overall transfer level: Needs assistance Equipment used: Rolling walker (2 wheeled) Transfers: Sit to/from Stand Sit to Stand: Mod assist;+2 physical assistance          General transfer comment: cues for hands, but places hand to rw before stand. Cues for PWB on R; complies primarily due to pain  Ambulation/Gait Ambulation/Gait assistance: Min assist;+2 physical assistance Ambulation Distance (Feet): 4 Feet (bed to chair) Assistive device: Rolling walker (2 wheeled) Gait Pattern/deviations: Step-to pattern;Decreased stride length;Shuffle     General Gait Details: lowered rw for appropriate fit and improved ability to weightbear on UEs for R PWB compliance. Overall fairly compliant with R PWB due to pain and limits weight bear amount and time on RLE.    Stairs            Wheelchair Mobility    Modified Rankin (Stroke Patients Only)       Balance Overall balance assessment: Needs assistance Sitting-balance support: Bilateral upper extremity supported Sitting balance-Leahy Scale: Poor Sitting balance - Comments: pushes into retropulsion Postural control: Posterior lean Standing balance support: Bilateral upper extremity supported Standing balance-Leahy Scale: Poor                      Cognition Arousal/Alertness: Awake/alert Behavior During Therapy: WFL for tasks assessed/performed Overall Cognitive Status: History of cognitive impairments - at baseline       Memory: Decreased short-term memory              Exercises Total Joint Exercises Ankle Circles/Pumps: AROM;Both;20 reps;Supine Quad Sets: Strengthening;Both;10 reps;Supine Towel Squeeze: Strengthening;Both;10 reps;Supine Short Arc Quad: AAROM;Both;10 reps;Supine Heel Slides: AAROM;Both;10 reps;Supine Hip ABduction/ADduction: AAROM;Both;10 reps;Supine Straight Leg Raises: AAROM;Both;10 reps;Supine;PROM    General Comments  Pertinent Vitals/Pain Pain Assessment:  (Reports medium pain with exercises; alot with sit edge bed)    Home Living                      Prior Function            PT Goals (current goals can now be found in the  care plan section)      Frequency  BID    PT Plan Current plan remains appropriate    Co-evaluation             End of Session Equipment Utilized During Treatment: Gait belt Activity Tolerance: Patient limited by pain Patient left: in chair;with call bell/phone within reach;with chair alarm set;with family/visitor present;with SCD's reapplied     Time: [id number] PT Time Calculation (min) (ACUTE ONLY): 27 min  Charges:  $Gait Training: 8-22 mins $Therapeutic Exercise: 8-22 mins                    G Codes:      Kristeen MissHeidi Elizabeth Bishop 04/13/2015, 11:20 AM

## 2015-04-13 NOTE — Progress Notes (Signed)
Pt did have a BM after receiving a fleets enema.  Is was many hard balls.  Dr Cherlynn KaiserSainani ordered a once of lactulose and said to discharge the pt

## 2015-04-13 NOTE — Clinical Social Work Placement (Signed)
   CLINICAL SOCIAL WORK PLACEMENT  NOTE  Date:  04/13/2015  Patient Details  Name: Mercedes Powell MRN: [id number] Date of Birth: 02-05-1929  Clinical Social Work is seeking post-discharge placement for this patient at the Skilled  Nursing Facility level of care (*CSW will initial, date and re-position this form in  chart as items are completed):  Yes   Patient/family provided with [place] Clinical Social Work Department's list of facilities offering this level of care within the geographic area requested by the patient (or if unable, by the patient's family).  Yes   Patient/family informed of their freedom to choose among providers that offer the needed level of care, that participate in Medicare, Medicaid or managed care program needed by the patient, have an available bed and are willing to accept the patient.  Yes   Patient/family informed of [place]'s ownership interest in [facility] and [facility], as well as of the fact that they are under no obligation to receive care at these facilities.  PASRR submitted to EDS on 04/12/15     PASRR number received on 04/12/15     Existing PASRR number confirmed on       FL2 transmitted to all facilities in geographic area requested by pt/family on 04/12/15     FL2 transmitted to all facilities within larger geographic area on       Patient informed that his/her managed care company has contracts with or will negotiate with certain facilities, including the following:        Yes   Patient/family informed of bed offers received.  [facility]     Physician recommends and patient chooses bed at  [facility](SNF)    [facility] 04/13/15.  [facility]     Patient family notified on 04/13/15 of transfer.  Name of family member notified:  [name], son     PHYSICIAN       Additional Comment:     _______________________________________________ Dede QuerySarah Pinchus Weckwerth, LCSW 04/13/2015, 4:30 PM

## 2015-04-13 NOTE — Progress Notes (Addendum)
Subjective:  POD #2 s/p right hip hemiarthroplasty.  Patient OOB to a chair.  Family at the bedside.  Patient denies CP, SOB, abdominal pain or nausea/vomiting.  Patient reports pain as mild.    Objective:   VITALS:   Filed Vitals:   04/13/15 0440 04/13/15 0443 04/13/15 0818 04/13/15 1037  BP: 138/50 135/71 124/59   Pulse: 93 87 80 90  Temp: 98 F (36.7 C)  99 F (37.2 C)   TempSrc: Oral  Oral   Resp: 18  18   Height:      Weight:      SpO2: 100%  98% 95%    PHYSICAL EXAM:  Right lower extremity:  Dressing is C/D/I.  No erythema or ecchymosis seen.  Thigh and leg compartments are soft and compressible.  She has palpable pedal pulses and intact motor function.  Sensation is intact to light touch.   LABS  Results for orders placed or performed during the hospital encounter of 04/10/15 (from the past 24 hour(s))  CBC     Status: Abnormal   Collection Time: 04/13/15  5:38 AM  Result Value Ref Range   WBC 9.2 3.6 - 11.0 K/uL   RBC 3.14 (L) 3.80 - 5.20 MIL/uL   Hemoglobin 9.6 (L) 12.0 - 16.0 g/dL   HCT 16.1 (L) 09.6 - 04.5 %   MCV 90.1 80.0 - 100.0 fL   MCH 30.4 26.0 - 34.0 pg   MCHC 33.7 32.0 - 36.0 g/dL   RDW 40.9 81.1 - 91.4 %   Platelets 180 150 - 440 K/uL  Basic metabolic panel     Status: Abnormal   Collection Time: 04/13/15  5:38 AM  Result Value Ref Range   Sodium 132 (L) 135 - 145 mmol/L   Potassium 3.7 3.5 - 5.1 mmol/L   Chloride 99 (L) 101 - 111 mmol/L   CO2 25 22 - 32 mmol/L   Glucose, Bld 143 (H) 65 - 99 mg/dL   BUN 13 6 - 20 mg/dL   Creatinine, Ser 7.82 0.44 - 1.00 mg/dL   Calcium 8.9 8.9 - 95.6 mg/dL   GFR calc non Af Amer >60 >60 mL/min   GFR calc Af Amer >60 >60 mL/min   Anion gap 8 5 - 15    Dg Hip Port Unilat With Pelvis 1v Right  04/11/2015  CLINICAL DATA:  Right hip fracture. EXAM: DG HIP (WITH OR WITHOUT PELVIS) 1V PORT RIGHT COMPARISON:  Radiographs dated 04/10/2015 FINDINGS: AP and lateral views demonstrate the patient has undergone right  hip hemiarthroplasty. The prosthesis appears in excellent position in the AP and lateral projection. No appreciable fractures. Postsurgical air in the soft tissues as expected. IMPRESSION: Satisfactory appearance of the right hip after hemiarthroplasty. Electronically Signed   By: Francene Boyers M.D.   On: 04/11/2015 14:19    Assessment/Plan: 2 Days Post-Op   Active Problems:   Hip fracture [facility])  Patient doing well post-op.  She has made progress with PT.  OK for discharge to rehab from an orthopaedic standpoint. Patient will be discharged to rehab on posterior hip precautions. Patient will remain partial weightbearing on the operative extremity for 4 weeks postop. They will use a walker for assistance with ambulation. Patient will continue TED stockings until follow-up in the office. Patient should continue physical therapy for gait training, lower extremity strengthening and hip range of motion.  Lovenox  SQ daily for DVT prophylaxis.  Patient will follow up with me in 10-14 days for  wound check, staple removal and xrays.     Juanell FairlyKRASINSKI, Leeasia Secrist , MD 04/13/2015, 11:54 AM

## 2015-04-13 NOTE — Clinical Social Work Note (Signed)
Pt is [facility] for [facility] today [facility]. Pt's family is agreeable to discharge plan and are aware transfer today. Facility has received discharge information and is ready to admit pt. RN called report and EMS to provide transportation. CSW is signing off as no further needs identified.   Dede QuerySarah Kashtyn Jankowski, MSW, LCSW Clinical Social Worker  [phone]

## 2015-04-13 NOTE — Discharge Instructions (Signed)
Patient will be discharged to rehab on posterior hip precautions. Patient will remain partial weightbearing on the operative extremity for 4 weeks postop. They will use a walker for assistance with ambulation. Patient will continue TED stockings until follow-up in the office. Patient should continue physical therapy for gait training, lower extremity strengthening and hip range of motion.

## 2015-04-18 LAB — TSH: TSH: 4.07 u[IU]/mL (ref 0.350–4.500)

## 2015-04-24 ENCOUNTER — Encounter
Admission: RE | Admit: 2015-04-24 | Discharge: 2015-04-24 | Disposition: A | Discharge status: Home / Self Care | Payer: Medicare Other | Source: Ambulatory Visit | Attending: Internal Medicine | Admitting: Internal Medicine

## 2015-04-29 LAB — URINALYSIS COMPLETE WITH MICROSCOPIC (ARMC ONLY)
BACTERIA UA: NONE SEEN
Bilirubin Urine: NEGATIVE
Glucose, UA: NEGATIVE mg/dL
HGB URINE DIPSTICK: NEGATIVE
Ketones, ur: NEGATIVE mg/dL
LEUKOCYTES UA: NEGATIVE
Nitrite: NEGATIVE
PH: 6 (ref 5.0–8.0)
PROTEIN: NEGATIVE mg/dL
Specific Gravity, Urine: 1.011 (ref 1.005–1.030)

## 2015-05-01 LAB — URINE CULTURE

## 2015-05-04 NOTE — Care Management (Signed)
Post discharge: Medicare bundle patient. I have updated Medicare bundle team of discharge date/disposition.

## 2015-05-05 ENCOUNTER — Emergency Department: Payer: Medicare Other

## 2015-05-05 ENCOUNTER — Encounter: Payer: Self-pay | Admitting: *Deleted

## 2015-05-05 ENCOUNTER — Emergency Department
Admission: EM | Admit: 2015-05-05 | Discharge: 2015-05-06 | Disposition: A | Discharge status: Other Acute Inpatient Hospital | Payer: Medicare Other | Attending: Emergency Medicine | Admitting: Emergency Medicine

## 2015-05-05 DIAGNOSIS — Y9289 Other specified places as the place of occurrence of the external cause: Secondary | ICD-10-CM | POA: Insufficient documentation

## 2015-05-05 DIAGNOSIS — Z87891 Personal history of nicotine dependence: Secondary | ICD-10-CM | POA: Diagnosis not present

## 2015-05-05 DIAGNOSIS — S79001A Unspecified physeal fracture of upper end of right femur, initial encounter for closed fracture: Secondary | ICD-10-CM | POA: Insufficient documentation

## 2015-05-05 DIAGNOSIS — I10 Essential (primary) hypertension: Secondary | ICD-10-CM | POA: Insufficient documentation

## 2015-05-05 DIAGNOSIS — Y998 Other external cause status: Secondary | ICD-10-CM | POA: Insufficient documentation

## 2015-05-05 DIAGNOSIS — S52591A Other fractures of lower end of right radius, initial encounter for closed fracture: Secondary | ICD-10-CM | POA: Diagnosis not present

## 2015-05-05 DIAGNOSIS — W1839XA Other fall on same level, initial encounter: Secondary | ICD-10-CM | POA: Diagnosis not present

## 2015-05-05 DIAGNOSIS — S52601A Unspecified fracture of lower end of right ulna, initial encounter for closed fracture: Secondary | ICD-10-CM

## 2015-05-05 DIAGNOSIS — S52691A Other fracture of lower end of right ulna, initial encounter for closed fracture: Secondary | ICD-10-CM | POA: Diagnosis not present

## 2015-05-05 DIAGNOSIS — Z79899 Other long term (current) drug therapy: Secondary | ICD-10-CM | POA: Insufficient documentation

## 2015-05-05 DIAGNOSIS — Y9389 Activity, other specified: Secondary | ICD-10-CM | POA: Diagnosis not present

## 2015-05-05 DIAGNOSIS — S79911A Unspecified injury of right hip, initial encounter: Secondary | ICD-10-CM | POA: Diagnosis present

## 2015-05-05 DIAGNOSIS — E871 Hypo-osmolality and hyponatremia: Secondary | ICD-10-CM | POA: Diagnosis not present

## 2015-05-05 DIAGNOSIS — S72001A Fracture of unspecified part of neck of right femur, initial encounter for closed fracture: Secondary | ICD-10-CM

## 2015-05-05 DIAGNOSIS — S52501A Unspecified fracture of the lower end of right radius, initial encounter for closed fracture: Secondary | ICD-10-CM

## 2015-05-05 LAB — BASIC METABOLIC PANEL
Anion gap: 9 (ref 5–15)
BUN: 27 mg/dL — ABNORMAL HIGH (ref 6–20)
CALCIUM: 9.9 mg/dL (ref 8.9–10.3)
CHLORIDE: 82 mmol/L — AB (ref 101–111)
CO2: 32 mmol/L (ref 22–32)
CREATININE: 0.98 mg/dL (ref 0.44–1.00)
GFR calc Af Amer: 60 mL/min — ABNORMAL LOW (ref >60)
GFR calc non Af Amer: 52 mL/min — ABNORMAL LOW (ref >60)
GLUCOSE: 213 mg/dL — AB (ref 65–99)
Potassium: 3.1 mmol/L — ABNORMAL LOW (ref 3.5–5.1)
Sodium: 123 mmol/L — ABNORMAL LOW (ref 135–145)

## 2015-05-05 LAB — TROPONIN I: Troponin I: <0.03 ng/mL (ref <0.031)

## 2015-05-05 LAB — CBC WITH DIFFERENTIAL/PLATELET
Basophils Absolute: 0 10*3/uL (ref 0–0.1)
Basophils Relative: 0 %
EOS ABS: 0 10*3/uL (ref 0–0.7)
Eosinophils Relative: 0 %
HEMATOCRIT: 30.9 % — AB (ref 35.0–47.0)
HEMOGLOBIN: 10.5 g/dL — AB (ref 12.0–16.0)
LYMPHS ABS: 1.2 10*3/uL (ref 1.0–3.6)
Lymphocytes Relative: 8 %
MCH: 30.2 pg (ref 26.0–34.0)
MCHC: 33.9 g/dL (ref 32.0–36.0)
MCV: 89 fL (ref 80.0–100.0)
MONOS PCT: 10 %
Monocytes Absolute: 1.5 10*3/uL — ABNORMAL HIGH (ref 0.2–0.9)
NEUTROS ABS: 12.1 10*3/uL — AB (ref 1.4–6.5)
NEUTROS PCT: 82 %
Platelets: 314 10*3/uL (ref 150–440)
RBC: 3.47 MIL/uL — ABNORMAL LOW (ref 3.80–5.20)
RDW: 14.4 % (ref 11.5–14.5)
WBC: 14.8 10*3/uL — ABNORMAL HIGH (ref 3.6–11.0)

## 2015-05-05 LAB — PROTIME-INR
INR: 1.01
PROTHROMBIN TIME: 13.5 s (ref 11.4–15.0)

## 2015-05-05 LAB — APTT: APTT: 29 s (ref 24–36)

## 2015-05-05 MED ORDER — ONDANSETRON HCL 4 MG/2ML IJ SOLN
INTRAMUSCULAR | Status: AC
  Administered 2015-05-05: 4 mg via INTRAVENOUS
  Filled 2015-05-05: qty 2

## 2015-05-05 MED ORDER — ONDANSETRON HCL 4 MG/2ML IJ SOLN
4.0000 mg | Freq: Once | INTRAMUSCULAR | Status: AC
  Administered 2015-05-05: 4 mg via INTRAVENOUS

## 2015-05-05 MED ORDER — SODIUM CHLORIDE 0.9 % IV SOLN: Freq: Once | INTRAVENOUS | Status: DC

## 2015-05-05 MED ORDER — MORPHINE SULFATE (PF) 4 MG/ML IV SOLN
4.0000 mg | Freq: Once | INTRAVENOUS | Status: AC
  Administered 2015-05-05: 4 mg via INTRAVENOUS

## 2015-05-05 MED ORDER — MORPHINE SULFATE (PF) 4 MG/ML IV SOLN
INTRAVENOUS | Status: AC
  Administered 2015-05-05: 4 mg via INTRAVENOUS
  Filled 2015-05-05: qty 1

## 2015-05-05 NOTE — ED Notes (Signed)
[facility] after a witness fall after pt stood up from bed to ambulate to bathroom. Staff was unsure if pt tripped but denies LOC or syncope. Pt has obvious deformity and bruising to right wrist and shorted rotated right hip. EMS reported pt did hit head but pt denies head pain at this time. Pt is reported to be at baseline with dementia. Pt oriented to only place and situation. Pt has hx of right femur head fracture.

## 2015-05-05 NOTE — ED Provider Notes (Signed)
[facility] Emergency Department Provider Note  ____________________________________________  Time seen: Seen upon arrival to the emergency department  I have reviewed the triage vital signs and the nursing notes.   HISTORY  Chief Complaint Fall    HPI Mercedes Powell is a 82 y.o. female with a recent hip fracture this past December who is presenting today with a fall and right hip and wrist deformity. He was a witnessed fall. No loss of consciousness per EMS. Obvious deformity of the right wrist as well as a shortened and rotated right lower extremity. The patient is denying any pain at this time. She denies a headache. She says that she is able to move her toes as well as her hand on the right.   Past Medical History  Diagnosis Date  . Hypertension   . Anxiety   . Chronic leg pain   . HTN (hypertension)   . Hypothyroid   . Raynaud phenomenon   . Dementia     Patient Active Problem List   Diagnosis Date Noted  . Hip fracture (HCC) 04/10/2015    Past Surgical History  Procedure Laterality Date  . Appendectomy    . Abdominal hysterectomy    . Breast lump removal    . Hip arthroplasty Right 04/11/2015    Procedure: ARTHROPLASTY BIPOLAR HIP (HEMIARTHROPLASTY);  Surgeon: Juanell FairlyKevin Krasinski, MD;  Location: [facility] ORS;  Service: Orthopedics;  Laterality: Right;    Current Outpatient Rx  Name  Route  Sig  Dispense  Refill  . amLODipine (NORVASC) 5 MG tablet   Oral   Take 1 tablet (5 mg total) by mouth once.   15 tablet   0   . Calcium 500 MG CHEW   Oral   Chew 500 mg by mouth daily.         . Cholecalciferol (VITAMIN D) 2000 UNITS CAPS   Oral   Take 2,000 Units by mouth daily.         . diphenoxylate-atropine (LOMOTIL) 2.5-0.025 MG tablet   Oral   Take 1 tablet by mouth 3 (three) times daily as needed for diarrhea or loose stools.         Marland Kitchen. EXPIRED: enoxaparin (LOVENOX) 30 MG/0.3ML injection   Subcutaneous   Inject 0.3 mLs (30 mg  total) into the skin daily.   0 Syringe        To stop on 05/02/2015   . hydrochlorothiazide (HYDRODIURIL) 12.5 MG tablet   Oral   Take 12.5 mg by mouth daily.         Marland Kitchen. HYDROcodone-acetaminophen (NORCO/VICODIN) 5-325 MG tablet   Oral   Take 1-2 tablets by mouth every 6 (six) hours as needed for moderate pain.   30 tablet   0   . levothyroxine (SYNTHROID, LEVOTHROID) 75 MCG tablet   Oral   Take 75 mcg by mouth daily before breakfast.         . Multiple Vitamin (MULTIVITAMIN WITH MINERALS) TABS tablet   Oral   Take 1 tablet by mouth daily.         Marland Kitchen. PARoxetine (PAXIL) 10 MG tablet   Oral   Take 10 mg by mouth daily.         Bertram Gala. Polyethyl Glycol-Propyl Glycol (SYSTANE) 0.4-0.3 % GEL ophthalmic gel   Both Eyes   Place 1 application into both eyes as needed (for dry eyes).         . polyvinyl alcohol (LIQUIFILM TEARS) 1.4 % ophthalmic solution  Both Eyes   Place 1 drop into both eyes 4 (four) times daily.           Allergies Ace inhibitors; Galantamine; Maxidex; Maxzide; Procardia; and Zoloft  Family History  Problem Relation Age of Onset  . Hypertension      Social History Social History  Substance Use Topics  . Smoking status: Former Games developer  . Smokeless tobacco: None  . Alcohol Use: No    Review of Systems Constitutional: No fever/chills Eyes: No visual changes. ENT: No sore throat. Cardiovascular: Denies chest pain. Respiratory: Denies shortness of breath. Gastrointestinal: No abdominal pain.  No nausea, no vomiting.  No diarrhea.  No constipation. Genitourinary: Negative for dysuria. Musculoskeletal: Negative for back pain. Skin: Negative for rash. Neurological: Negative for headaches, focal weakness or numbness.  10-point ROS otherwise negative.  ____________________________________________   PHYSICAL EXAM:  VITAL SIGNS: ED Triage Vitals  Enc Vitals Group     BP 05/05/15 2137 168/70 mmHg     Pulse Rate 05/05/15 2137 81     Resp  05/05/15 2137 16     Temp 05/05/15 2137 98.1 F (36.7 C)     Temp Source 05/05/15 2137 Oral     SpO2 05/05/15 2137 100 %     Weight 05/05/15 2137 104 lb (47.174 kg)     Height 05/05/15 2137 5\' 4"  (1.626 m)     Head Cir --      Peak Flow --      Pain Score --      Pain Loc --      Pain Edu? --      Excl. in GC? --     Constitutional: Alert and oriented. Well appearing and in no acute distress. Eyes: Conjunctivae are normal. PERRL. EOMI. Head: Atraumatic. Nose: No congestion/rhinnorhea. Mouth/Throat: Mucous membranes are moist.   Neck: No stridor.   Cardiovascular: Normal rate, regular rhythm. Grossly normal heart sounds.  Good peripheral circulation. Respiratory: Normal respiratory effort.  No retractions. Lungs CTAB. Gastrointestinal: Soft and nontender. No distention. No abdominal bruits. No CVA tenderness. Musculoskeletal: Shortened and externally rotated right lower extremity. Tender to the right hip. Able to range her toes to the right foot. Palpable dorsalis pedis pulse and sensation to light touch is intact. Right wrist with Bullard ecchymosis as well as a posterior deformity. There is a palpable radial pulse. She is able to range her fingers. There is a less than 2 second capillary refill. Sensation is intact to light touch. There is no tenderness or deformity to the elbow or forearm proximal to the wrist. Neurologic:  Normal speech and language. No gross focal neurologic deficits are appreciated. No gait instability. Skin:  Skin is warm, dry and intact. No rash noted. Psychiatric: Mood and affect are normal. Speech and behavior are normal.  ____________________________________________   LABS (all labs ordered are listed, but only abnormal results are displayed)  Labs Reviewed  CBC WITH DIFFERENTIAL/PLATELET  BASIC METABOLIC PANEL  TROPONIN I  PROTIME-INR  APTT  TYPE AND SCREEN   ____________________________________________  EKG  ED ECG REPORT I, Kyleigh Nannini,   Teena Irani, the attending physician, personally viewed and interpreted this ECG.   Date: 05/06/2015  EKG Time: 2241  Rate: 93  Rhythm: normal sinus rhythm  Axis: Normal axis  Intervals:none  ST&T Change: T wave inversions in 1, aVL. 1 mm ST depression in V5 and V6. Appear to be mild ST depressions on previous EKG from 11/9.  ____________________________________________  RADIOLOGY  Chest x-ray with  no active disease. Right hip with acute comminuted mildly displaced fracture of the proximal right femur. Wrist x-ray with displaced negative fractures of the distal radius and ulna. CT head without acute disease. CT C-spine without acute disease. ____________________________________________   PROCEDURES  Reduction of dislocation Date/Time: 1240am Performed by: Arelia Longest Authorized by: Arelia Longest Consent: Verbal consent obtained from daughter-in-law  Risks and benefits: risks, benefits and alternatives were discussed Consent given by: patient Required items: required blood products, implants, devices, and special equipment available Time out: Immediately prior to procedure a "time out" was called to verify the correct patient, procedure, equipment, support staff and site/side marked as required.  Patient sedated: No sedation required  Vitals: Vital signs were monitored during sedation. Patient tolerance: Patient tolerated the procedure well with no immediate complications. Joint: Right wrist with fracture dislocation. Reduction technique: 6 mL of 1% lidocaine of hematoma block.  Right elbow held in flexion. Forearm traction held by tach as I performed hand traction as well as bowl are pressure. Patient tolerated the procedure well while we reapproximated her hand on her distal wrist. Patient was neurovascularly intact status post the reduction with palpable radial pulse as well as ability to move her fingers and sensation to light touch. Patient was placed in a sugar tong  splint. Neurovascularly intact after splinting.     ____________________________________________   INITIAL IMPRESSION / ASSESSMENT AND PLAN / ED COURSE  Pertinent labs & imaging results that were available during my care of the patient were reviewed by me and considered in my medical decision making (see chart for details).  ----------------------------------------- 1:06 AM on 05/06/2015 -----------------------------------------  Initially spoke with Dr. Hyacinth Meeker who said that he would not be able to repair the patient's hip fracture. He said that it may require total revision and he would not be able to do this year at [facility]. He suggested we attempted transfer to a tertiary care center such as [facility] or [facility]. I spoke with the do transfer center but [facility] cannot accept transfer at this time due to capacity issues. Also discussed with Dr. Everardo Pacific at [facility] who said that their joint team will not be here for 3-4 days.  He said that [facility] would likely be able to accept the patient once they were able to confirm acceptance from the joint team. Dr.Varkey recommended admission to [place] until able to be transferred to [facility].  At this point I called Dr. Hyacinth Meeker back of the orthopedic service here in [place] who agreed to see the patient at [facility] until she could be transferred. I also discussed that with the daughter-in-law who understands his way to comply. She was also told about the low sodium. The patient was started on maintenance IV fluids.  Also postreduction films showing much improved alignment of the distal radius and ulna. Patient also with relief of pain.  ----------------------------------------- 1:30 AM on 05/06/2015 -----------------------------------------  Attempted to sign the patient out to the hospitalist here at [facility], Dr. Sheryle Hail. However, hospital medicine here a lot except until we try further placement options such as at [facility] in [place] and [place] in [place]. The  reason being that the patient would otherwise be sitting here at [facility] for several days without any definitive care. Dr. Alphonzo Lemmings to call [facility] as well as [facility] to see if he they will accept the patient. I also discussed this with the patient's daughter-in-law who agrees, but says will discuss with her family whether she will agree to have  the patient transferred to [facility] because of the distance. ____________________________________________   FINAL CLINICAL IMPRESSION(S) / ED DIAGNOSES  Hyponatremia. Right distal ulna and radius fracture. Right hip fracture. Mechanical fall.    Myrna Blazer, MD 05/06/15 [phone]

## 2015-05-06 ENCOUNTER — Emergency Department: Payer: Medicare Other

## 2015-05-06 LAB — PREPARE RBC (CROSSMATCH)

## 2015-05-06 MED ORDER — LIDOCAINE HCL (PF) 1 % IJ SOLN
INTRAMUSCULAR | Status: AC
  Filled 2015-05-06: qty 10

## 2015-05-06 MED ORDER — SENNA 8.6 MG PO TABS
1.0000 | ORAL_TABLET | Freq: Two times a day (BID) | ORAL | Status: DC
  Filled 2015-05-06 (×2): qty 1

## 2015-05-06 MED ORDER — METHOCARBAMOL 1000 MG/10ML IJ SOLN
500.0000 mg | Freq: Four times a day (QID) | INTRAVENOUS | Status: DC | PRN
  Filled 2015-05-06: qty 5

## 2015-05-06 MED ORDER — HYDROCODONE-ACETAMINOPHEN 5-325 MG PO TABS: 1.0000 | ORAL_TABLET | Freq: Four times a day (QID) | ORAL | Status: DC | PRN

## 2015-05-06 MED ORDER — ZOLPIDEM TARTRATE 5 MG PO TABS
5.0000 mg | ORAL_TABLET | Freq: Every evening | ORAL | Status: DC | PRN
Start: 2015-05-06 — End: 2015-05-06

## 2015-05-06 MED ORDER — METHOCARBAMOL 500 MG PO TABS: 500.0000 mg | ORAL_TABLET | Freq: Four times a day (QID) | ORAL | Status: DC | PRN

## 2015-05-06 MED ORDER — SODIUM CHLORIDE 0.9 % IV SOLN: Freq: Once | INTRAVENOUS | Status: DC

## 2015-05-06 MED ORDER — FENTANYL CITRATE (PF) 100 MCG/2ML IJ SOLN
50.0000 ug | Freq: Once | INTRAMUSCULAR | Status: AC
  Administered 2015-05-06: 50 ug via INTRAVENOUS
  Filled 2015-05-06: qty 2

## 2015-05-06 MED ORDER — POTASSIUM CHLORIDE CRYS ER 20 MEQ PO TBCR
20.0000 meq | EXTENDED_RELEASE_TABLET | Freq: Two times a day (BID) | ORAL | Status: DC
  Administered 2015-05-06: 20 meq via ORAL
  Filled 2015-05-06: qty 1

## 2015-05-06 MED ORDER — SENNOSIDES-DOCUSATE SODIUM 8.6-50 MG PO TABS
ORAL_TABLET | ORAL | Status: AC
  Administered 2015-05-06: 1
  Filled 2015-05-06: qty 1

## 2015-05-06 MED ORDER — MORPHINE SULFATE (PF) 2 MG/ML IV SOLN: 1.0000 mg | INTRAVENOUS | Status: DC | PRN

## 2015-05-06 NOTE — ED Notes (Signed)
Family at bedside. 

## 2015-05-06 NOTE — ED Notes (Signed)
Vital signs stable. 

## 2015-05-06 NOTE — ED Notes (Signed)
Pt in bed with eyes closed, VSS, no needs identified

## 2015-05-06 NOTE — ED Notes (Signed)
Pt resting, no acute distress noted at this time, family member at bedside

## 2015-05-06 NOTE — ED Notes (Signed)
Patient denies pain and is resting comfortably.  

## 2015-05-06 NOTE — ED Notes (Signed)
Pt transported by [place] ems to [facility]. Left ac iv and foley cath left in place for transfer. Fentanyl 50 mcg given for transport. Ems aware. Pt with 10/10 right leg pain with movement. Charge RN [facility] ER notified that pt is on way and update report given. Report already given by nite shift.

## 2015-05-06 NOTE — ED Notes (Signed)
Pt in bed with eyes closed, vss. No acute distress n oted

## 2015-05-06 NOTE — ED Provider Notes (Signed)
-----------------------------------------   2:04 AM on 05/06/2015 -----------------------------------------  Signed out to me  by my partner Dr. Langston MaskerShaevitz. Patient with a femur fracture and a wrist fracture hyponatremia. [facility] can ask that the patient for 3 days, D cannot sit the patient because of no beds, he did aspirate a [facility], Dr. August Saucerean, will not be able to the patient as he cannot repair this, our hospitalist doctors have been asked to admit the patient pending definitive disposition and they would prefer not to do so as they cannot get anyone here to repair the hip apparently. Therefore I will talk to the family about whether they would like to see if I can transfer the patient to [facility].  Jeanmarie PlantJames A Joss Friedel, MD 05/06/15 [phone]

## 2015-05-07 LAB — TYPE AND SCREEN
ABO/RH(D): O POS
Antibody Screen: NEGATIVE
UNIT DIVISION: 0
Unit division: 0

## 2015-05-16 LAB — BASIC METABOLIC PANEL
Anion gap: 9 (ref 5–15)
BUN: 25 mg/dL — ABNORMAL HIGH (ref 6–20)
CHLORIDE: 93 mmol/L — AB (ref 101–111)
CO2: 30 mmol/L (ref 22–32)
CREATININE: 0.99 mg/dL (ref 0.44–1.00)
Calcium: 10.1 mg/dL (ref 8.9–10.3)
GFR calc non Af Amer: 51 mL/min — ABNORMAL LOW (ref >60)
GFR, EST AFRICAN AMERICAN: 59 mL/min — AB (ref >60)
Glucose, Bld: 111 mg/dL — ABNORMAL HIGH (ref 65–99)
POTASSIUM: 3.9 mmol/L (ref 3.5–5.1)
SODIUM: 132 mmol/L — AB (ref 135–145)

## 2015-05-23 LAB — BASIC METABOLIC PANEL
Anion gap: 7 (ref 5–15)
BUN: 19 mg/dL (ref 6–20)
CALCIUM: 9.8 mg/dL (ref 8.9–10.3)
CO2: 32 mmol/L (ref 22–32)
Chloride: 97 mmol/L — ABNORMAL LOW (ref 101–111)
Creatinine, Ser: 0.87 mg/dL (ref 0.44–1.00)
GFR calc Af Amer: >60 mL/min (ref >60)
GFR, EST NON AFRICAN AMERICAN: 59 mL/min — AB (ref >60)
GLUCOSE: 109 mg/dL — AB (ref 65–99)
Potassium: 3.6 mmol/L (ref 3.5–5.1)
Sodium: 136 mmol/L (ref 135–145)

## 2015-05-24 ENCOUNTER — Encounter
Admission: RE | Admit: 2015-05-24 | Discharge: 2015-05-24 | Disposition: A | Discharge status: Home / Self Care | Payer: Medicare Other | Source: Ambulatory Visit | Attending: Internal Medicine | Admitting: Internal Medicine

## 2015-06-21 ENCOUNTER — Encounter
Admission: RE | Admit: 2015-06-21 | Discharge: 2015-06-21 | Disposition: A | Discharge status: Home / Self Care | Payer: Medicare Other | Source: Ambulatory Visit | Attending: Internal Medicine | Admitting: Internal Medicine

## 2015-07-10 ENCOUNTER — Other Ambulatory Visit: Payer: Self-pay | Admitting: Orthopedic Surgery

## 2015-07-11 ENCOUNTER — Emergency Department: Payer: Medicare Other

## 2015-07-11 ENCOUNTER — Encounter: Payer: Self-pay | Admitting: Radiology

## 2015-07-11 ENCOUNTER — Emergency Department
Admission: EM | Admit: 2015-07-11 | Discharge: 2015-07-11 | Disposition: A | Discharge status: Home / Self Care | Payer: Medicare Other | Attending: Emergency Medicine | Admitting: Emergency Medicine

## 2015-07-11 DIAGNOSIS — F419 Anxiety disorder, unspecified: Secondary | ICD-10-CM | POA: Insufficient documentation

## 2015-07-11 DIAGNOSIS — T148XXA Other injury of unspecified body region, initial encounter: Secondary | ICD-10-CM

## 2015-07-11 DIAGNOSIS — I10 Essential (primary) hypertension: Secondary | ICD-10-CM | POA: Insufficient documentation

## 2015-07-11 DIAGNOSIS — Y999 Unspecified external cause status: Secondary | ICD-10-CM | POA: Insufficient documentation

## 2015-07-11 DIAGNOSIS — S20302A Unspecified superficial injuries of left front wall of thorax, initial encounter: Secondary | ICD-10-CM | POA: Diagnosis present

## 2015-07-11 DIAGNOSIS — Z79899 Other long term (current) drug therapy: Secondary | ICD-10-CM | POA: Diagnosis not present

## 2015-07-11 DIAGNOSIS — Y92002 Bathroom of unspecified non-institutional (private) residence single-family (private) house as the place of occurrence of the external cause: Secondary | ICD-10-CM | POA: Diagnosis not present

## 2015-07-11 DIAGNOSIS — Z7901 Long term (current) use of anticoagulants: Secondary | ICD-10-CM | POA: Insufficient documentation

## 2015-07-11 DIAGNOSIS — Z96641 Presence of right artificial hip joint: Secondary | ICD-10-CM | POA: Diagnosis not present

## 2015-07-11 DIAGNOSIS — F0390 Unspecified dementia without behavioral disturbance: Secondary | ICD-10-CM | POA: Insufficient documentation

## 2015-07-11 DIAGNOSIS — W19XXXA Unspecified fall, initial encounter: Secondary | ICD-10-CM | POA: Diagnosis not present

## 2015-07-11 DIAGNOSIS — R079 Chest pain, unspecified: Secondary | ICD-10-CM

## 2015-07-11 DIAGNOSIS — E039 Hypothyroidism, unspecified: Secondary | ICD-10-CM | POA: Diagnosis not present

## 2015-07-11 DIAGNOSIS — Y9301 Activity, walking, marching and hiking: Secondary | ICD-10-CM | POA: Insufficient documentation

## 2015-07-11 DIAGNOSIS — S20212A Contusion of left front wall of thorax, initial encounter: Secondary | ICD-10-CM | POA: Insufficient documentation

## 2015-07-11 DIAGNOSIS — Z87891 Personal history of nicotine dependence: Secondary | ICD-10-CM | POA: Diagnosis not present

## 2015-07-11 DIAGNOSIS — I7300 Raynaud's syndrome without gangrene: Secondary | ICD-10-CM | POA: Diagnosis not present

## 2015-07-11 LAB — COMPREHENSIVE METABOLIC PANEL
ALT: 18 U/L (ref 14–54)
ANION GAP: 6 (ref 5–15)
AST: 26 U/L (ref 15–41)
Albumin: 4.2 g/dL (ref 3.5–5.0)
Alkaline Phosphatase: 93 U/L (ref 38–126)
BUN: 17 mg/dL (ref 6–20)
CHLORIDE: 98 mmol/L — AB (ref 101–111)
CO2: 27 mmol/L (ref 22–32)
Calcium: 9.5 mg/dL (ref 8.9–10.3)
Creatinine, Ser: 1.05 mg/dL — ABNORMAL HIGH (ref 0.44–1.00)
GFR, EST AFRICAN AMERICAN: 55 mL/min — AB (ref >60)
GFR, EST NON AFRICAN AMERICAN: 47 mL/min — AB (ref >60)
Glucose, Bld: 118 mg/dL — ABNORMAL HIGH (ref 65–99)
POTASSIUM: 3.8 mmol/L (ref 3.5–5.1)
SODIUM: 131 mmol/L — AB (ref 135–145)
Total Bilirubin: 0.5 mg/dL (ref 0.3–1.2)
Total Protein: 7.5 g/dL (ref 6.5–8.1)

## 2015-07-11 LAB — CBC WITH DIFFERENTIAL/PLATELET
Basophils Absolute: 0 10*3/uL (ref 0–0.1)
Basophils Relative: 1 %
EOS ABS: 0 10*3/uL (ref 0–0.7)
EOS PCT: 1 %
HCT: 39.9 % (ref 35.0–47.0)
Hemoglobin: 13.5 g/dL (ref 12.0–16.0)
LYMPHS ABS: 1.7 10*3/uL (ref 1.0–3.6)
LYMPHS PCT: 33 %
MCH: 30.5 pg (ref 26.0–34.0)
MCHC: 33.8 g/dL (ref 32.0–36.0)
MCV: 90.4 fL (ref 80.0–100.0)
MONOS PCT: 12 %
Monocytes Absolute: 0.6 10*3/uL (ref 0.2–0.9)
Neutro Abs: 2.9 10*3/uL (ref 1.4–6.5)
Neutrophils Relative %: 55 %
PLATELETS: 238 10*3/uL (ref 150–440)
RBC: 4.42 MIL/uL (ref 3.80–5.20)
RDW: 15.1 % — AB (ref 11.5–14.5)
WBC: 5.3 10*3/uL (ref 3.6–11.0)

## 2015-07-11 LAB — URINALYSIS COMPLETE WITH MICROSCOPIC (ARMC ONLY)
BILIRUBIN URINE: NEGATIVE
Bacteria, UA: NONE SEEN
GLUCOSE, UA: NEGATIVE mg/dL
HGB URINE DIPSTICK: NEGATIVE
KETONES UR: NEGATIVE mg/dL
Leukocytes, UA: NEGATIVE
NITRITE: NEGATIVE
Protein, ur: NEGATIVE mg/dL
SPECIFIC GRAVITY, URINE: 1.017 (ref 1.005–1.030)
Squamous Epithelial / LPF: NONE SEEN
pH: 8 (ref 5.0–8.0)

## 2015-07-11 LAB — TROPONIN I

## 2015-07-11 LAB — LIPASE, BLOOD: LIPASE: 30 U/L (ref 11–51)

## 2015-07-11 LAB — CK: CK TOTAL: 33 U/L — AB (ref 38–234)

## 2015-07-11 MED ORDER — ACETAMINOPHEN 325 MG PO TABS
650.0000 mg | ORAL_TABLET | Freq: Once | ORAL | Status: AC
  Administered 2015-07-11: 650 mg via ORAL
  Filled 2015-07-11: qty 2

## 2015-07-11 MED ORDER — IOHEXOL 300 MG/ML  SOLN
100.0000 mL | Freq: Once | INTRAMUSCULAR | Status: AC | PRN
  Administered 2015-07-11: 100 mL via INTRAVENOUS

## 2015-07-11 MED ORDER — SODIUM CHLORIDE 0.9 % IV BOLUS (SEPSIS)
500.0000 mL | Freq: Once | INTRAVENOUS | Status: AC
  Administered 2015-07-11: 500 mL via INTRAVENOUS

## 2015-07-11 NOTE — ED Notes (Signed)
Patient presents to the ED via [facility].  Patient is complaining of pain to her left side.  No bruising noted.  Patient states she fell after going to the bathroom.  Patient denies passing out.  Fall was unwitnessed.  Patient is alert and oriented to self and place but not time.

## 2015-07-11 NOTE — ED Notes (Signed)
Care handed off from Anna, RN 

## 2015-07-11 NOTE — ED Provider Notes (Signed)
[facility] Emergency Department Provider Note  ____________________________________________  Time seen: Seen upon arrival to the emergency department  I have reviewed the triage vital signs and the nursing notes.   HISTORY  Chief Complaint Fall    HPI Mercedes Powell is a 82 y.o. female with a history of hypertension and dementia who is presenting to the emergency department today after a fall. The fall was unwitnessed and the patient does not remember the exact details. She says "I just fell." She does not rub or passing out or losing consciousness. She says that she has pain to her left side right now. She points to her ribs all the way through her pelvis. She was unable to get up on her own after she fell. She denies any headache, nausea vomiting or dizziness.   Past Medical History  Diagnosis Date  . Hypertension   . Anxiety   . Chronic leg pain   . HTN (hypertension)   . Hypothyroid   . Raynaud phenomenon   . Dementia     Patient Active Problem List   Diagnosis Date Noted  . Hip fracture (HCC) 04/10/2015    Past Surgical History  Procedure Laterality Date  . Appendectomy    . Abdominal hysterectomy    . Breast lump removal    . Hip arthroplasty Right 04/11/2015    Procedure: ARTHROPLASTY BIPOLAR HIP (HEMIARTHROPLASTY);  Surgeon: Juanell Fairly, MD;  Location: [facility] ORS;  Service: Orthopedics;  Laterality: Right;    Current Outpatient Rx  Name  Route  Sig  Dispense  Refill  . acetaminophen (TYLENOL) 325 MG tablet   Oral   Take 650 mg by mouth as needed for mild pain, moderate pain or fever.         Marland Kitchen amLODipine (NORVASC) 5 MG tablet   Oral   Take 1 tablet (5 mg total) by mouth once.   15 tablet   0   . barrier cream (NON-SPECIFIED) CREA   Topical   Apply 1 application topically as needed. Apply to red areas on buttocks sacrum as needed         . Calcium 500 MG CHEW   Oral   Chew 500 mg by mouth daily.         .  Cholecalciferol (VITAMIN D) 2000 UNITS CAPS   Oral   Take 2,000 Units by mouth daily.         . iron polysaccharides (NIFEREX) 150 MG capsule   Oral   Take 150 mg by mouth daily.         Marland Kitchen levothyroxine (SYNTHROID, LEVOTHROID) 75 MCG tablet   Oral   Take 75 mcg by mouth daily before breakfast.         . Multiple Vitamin (MULTIVITAMIN WITH MINERALS) TABS tablet   Oral   Take 1 tablet by mouth daily.         . polyvinyl alcohol-povidone (HYPOTEARS) 1.4-0.6 % ophthalmic solution   Both Eyes   Place 1 drop into both eyes 4 (four) times daily.         Marland Kitchen EXPIRED: enoxaparin (LOVENOX) 30 MG/0.3ML injection   Subcutaneous   Inject 0.3 mLs (30 mg total) into the skin daily.   0 Syringe        To stop on 05/02/2015     Allergies Ace inhibitors; Galantamine; Maxidex; Maxzide; Procardia; and Zoloft  Family History  Problem Relation Age of Onset  . Hypertension      Social History  Social History  Substance Use Topics  . Smoking status: Former Games developer  . Smokeless tobacco: None  . Alcohol Use: No    Review of Systems Constitutional: No fever/chills Eyes: No visual changes. ENT: No sore throat. Cardiovascular: Left lower chest and flank pain. Respiratory: Denies shortness of breath. Gastrointestinal:   No nausea, no vomiting.  No diarrhea.  No constipation. Genitourinary: Negative for dysuria. Musculoskeletal: Negative for back pain. Skin: Negative for rash. Neurological: Negative for headaches, focal weakness or numbness.  10-point ROS otherwise negative.  ____________________________________________   PHYSICAL EXAM:  VITAL SIGNS: ED Triage Vitals  Enc Vitals Group     BP 07/11/15 0755 172/91 mmHg     Pulse Rate 07/11/15 0755 86     Resp 07/11/15 0755 16     Temp 07/11/15 0755 97.9 F (36.6 C)     Temp Source 07/11/15 0755 Oral     SpO2 07/11/15 0755 99 %     Weight 07/11/15 0800 110 lb (49.896 kg)     Height 07/11/15 0800 5' 1.5" (1.562 m)      Head Cir --      Peak Flow --      Pain Score 07/11/15 0758 4     Pain Loc --      Pain Edu? --      Excl. in GC? --     Constitutional: Alert and oriented to her name, place and birthdate but not the year. Well appearing and in no acute distress. Eyes: Conjunctivae are normal. PERRL. EOMI. Head: Atraumatic. Nose: No congestion/rhinnorhea. Mouth/Throat: Mucous membranes are moist.  Oropharynx non-erythematous. Neck: No stridor.  No tenderness palpation of the midline. No deformity or step-off. Cardiovascular: Normal rate, regular rhythm. Grossly normal heart sounds.  Good peripheral circulation. Respiratory: Normal respiratory effort.  No retractions. Lungs CTAB. Gastrointestinal: Soft with left upper and lower quadrant tenderness to palpation as well as tenderness to the left anterior as well as lateral ribs without any crepitus, or overlying ecchymosis.. No distention. No CVA tenderness. Musculoskeletal: No lower extremity tenderness nor edema.  No joint effusions. No limb shortening. 5 out of 5 strength to the bilateral lower extremities. Neurologic:  Normal speech and language. No gross focal neurologic deficits are appreciated.  Skin:  Skin is warm, dry and intact. No rash noted. Psychiatric: Mood and affect are normal. Speech and behavior are normal.  ____________________________________________   LABS (all labs ordered are listed, but only abnormal results are displayed)  Labs Reviewed  CK - Abnormal; Notable for the following:    Total CK 33 (*)    All other components within normal limits  URINALYSIS COMPLETEWITH MICROSCOPIC ([facility] ONLY) - Abnormal; Notable for the following:    Color, Urine STRAW (*)    APPearance CLEAR (*)    All other components within normal limits  CBC WITH DIFFERENTIAL/PLATELET - Abnormal; Notable for the following:    RDW 15.1 (*)    All other components within normal limits  COMPREHENSIVE METABOLIC PANEL - Abnormal; Notable for the following:     Sodium 131 (*)    Chloride 98 (*)    Glucose, Bld 118 (*)    Creatinine, Ser 1.05 (*)    GFR calc non Af Amer 47 (*)    GFR calc Af Amer 55 (*)    All other components within normal limits  TROPONIN I  LIPASE, BLOOD   ____________________________________________  EKG  ED ECG REPORT I, Arelia Longest, the attending physician, personally viewed  and interpreted this ECG.   Date: 07/11/2015  EKG Time: 8:04 AM  Rate: 83  Rhythm: normal sinus rhythm  Axis: Normal axis  Intervals:none  ST&T Change: No ST segment elevation or depression. T-wave inversions in 2, 3, aVF, V3 through V6. Multiple EKGs on record with a similar appearance to the EKG done on 02/27/2015.   ____________________________________________  RADIOLOGY  Imaging Results       CT Head Wo Contrast (Final result) Result time: 07/11/15 09:32:01   Final result by Rad Results In Interface (07/11/15 09:32:01)   Narrative:   CLINICAL DATA: Fall, left side pain  EXAM: CT HEAD WITHOUT CONTRAST  TECHNIQUE: Contiguous axial images were obtained from the base of the skull through the vertex without intravenous contrast.  COMPARISON: 05/05/2015  FINDINGS: Brain: No intracranial hemorrhage, mass effect or midline shift. Moderate cerebral atrophy again noted. Extensive periventricular and patchy subcortical chronic white matter disease again noted. No definite acute cortical infarction. No mass lesion is noted on this unenhanced scan. Ventricular size is stable from prior exam.  Vascular: Mild atherosclerotic calcifications of carotid siphon.  Skull: No depressed skull fracture. No destructive bony lesions.  Sinuses/Orbits: Visualized paranasal sinuses shows mild mucosal thickening with partial opacification left posterior aspect of ethmoid air cells. The mastoid air cells are unremarkable.  Other: None  IMPRESSION: No acute intracranial abnormality. Stable atrophy and chronic white matter  disease. No definite acute cortical infarction. Minimal mucosal thickening with partial opacification posterior aspect of left ethmoid air cells.   Electronically Signed By: Natasha Mead M.D. On: 07/11/2015 09:32          CT Chest W Contrast (Final result) Result time: 07/11/15 09:34:00   Final result by Rad Results In Interface (07/11/15 09:34:00)   Narrative:   CLINICAL DATA: Recent fall with pain, initial encounter  EXAM: CT CHEST, ABDOMEN, AND PELVIS WITH CONTRAST  TECHNIQUE: Multidetector CT imaging of the chest, abdomen and pelvis was performed following the standard protocol during bolus administration of intravenous contrast.  CONTRAST: OMNIPAQUE IOHEXOL 300 MG/ML SOLN  COMPARISON: None.  FINDINGS: CT CHEST FINDINGS  Mediastinum/Lymph Nodes: Thoracic inlet demonstrates small hypodensities within the thyroid gland. Calcific changes of the thoracic aorta are noted without aneurysmal dilatation or dissection. The pulmonary artery as visualized is within normal limits. No hilar or mediastinal adenopathy is seen.  Lungs/Pleura: The lungs are well aerated bilaterally. Mild apical pleural and parenchymal scarring is seen. No focal infiltrates or contusion is noted. No effusions are seen.  Musculoskeletal: No chest wall mass or suspicious bone lesions identified.  CT ABDOMEN PELVIS FINDINGS  Hepatobiliary: A tiny cyst is noted in the lateral segment of the left lobe of the liver. The remainder the liver is within normal limits. The gallbladder is partially decompressed.  Pancreas: No mass, inflammatory changes, or other significant abnormality.  Spleen: Within normal limits in size and appearance.  Adrenals/Urinary Tract: Cystic changes noted within the right kidney. No renal calculi or obstructive changes are noted. Some scarring in the left kidney is seen.  Stomach/Bowel: The appendix has been surgically removed. Mild diverticular change  is noted without diverticulitis.  Vascular/Lymphatic: No pathologically enlarged lymph nodes. No evidence of abdominal aortic aneurysm.  Reproductive: No mass or other significant abnormality.  Other: None.  Musculoskeletal: Postsurgical changes are noted in the right hip. Degenerative changes of the lumbar spine are noted.  IMPRESSION: No acute posttraumatic abnormality is noted.  Chronic changes as described above.   Electronically Signed By: Alcide Clever  M.D. On: 07/11/2015 09:34          CT Abdomen Pelvis W Contrast (Final result) Result time: 07/11/15 09:34:00   Final result by Rad Results In Interface (07/11/15 09:34:00)   Narrative:   CLINICAL DATA: Recent fall with pain, initial encounter  EXAM: CT CHEST, ABDOMEN, AND PELVIS WITH CONTRAST  TECHNIQUE: Multidetector CT imaging of the chest, abdomen and pelvis was performed following the standard protocol during bolus administration of intravenous contrast.  CONTRAST: 100mL OMNIPAQUE IOHEXOL 300 MG/ML SOLN  COMPARISON: None.  FINDINGS: CT CHEST FINDINGS  Mediastinum/Lymph Nodes: Thoracic inlet demonstrates small hypodensities within the thyroid gland. Calcific changes of the thoracic aorta are noted without aneurysmal dilatation or dissection. The pulmonary artery as visualized is within normal limits. No hilar or mediastinal adenopathy is seen.  Lungs/Pleura: The lungs are well aerated bilaterally. Mild apical pleural and parenchymal scarring is seen. No focal infiltrates or contusion is noted. No effusions are seen.  Musculoskeletal: No chest wall mass or suspicious bone lesions identified.  CT ABDOMEN PELVIS FINDINGS  Hepatobiliary: A tiny cyst is noted in the lateral segment of the left lobe of the liver. The remainder the liver is within normal limits. The gallbladder is partially decompressed.  Pancreas: No mass, inflammatory changes, or other significant abnormality.  Spleen:  Within normal limits in size and appearance.  Adrenals/Urinary Tract: Cystic changes noted within the right kidney. No renal calculi or obstructive changes are noted. Some scarring in the left kidney is seen.  Stomach/Bowel: The appendix has been surgically removed. Mild diverticular change is noted without diverticulitis.  Vascular/Lymphatic: No pathologically enlarged lymph nodes. No evidence of abdominal aortic aneurysm.  Reproductive: No mass or other significant abnormality.  Other: None.  Musculoskeletal: Postsurgical changes are noted in the right hip. Degenerative changes of the lumbar spine are noted.  IMPRESSION: No acute posttraumatic abnormality is noted.  Chronic changes as described above.   Electronically Signed By: Alcide CleverMark Lukens M.D. On: 07/11/2015 09:34       ____________________________________________   PROCEDURES  ___________________________________________   INITIAL IMPRESSION / ASSESSMENT AND PLAN / ED COURSE  Pertinent labs & imaging results that were available during my care of the patient were reviewed by me and considered in my medical decision making (see chart for details).  ----------------------------------------- 10:57 AM on 07/11/2015 -----------------------------------------  Patient is resting comfortably. Family has not bedside and says that likely what happened is that the patient was going to the bathroom and then did not wait for help in order to get up and walk. She had a fairly recent hip replacement and still needs assistance with ambulation. The family members suspecting that she had a mechanical fall. I'm suspect this today after a very reassuring workup. No acute findings of any internal injury on the CAT scans. Likely traumatic causes for pain. ____________________________________________   FINAL CLINICAL IMPRESSION(S) / ED DIAGNOSES  Final diagnoses:  Left sided chest pain   fall. Contusion.    Myrna Blazeravid  Matthew Chakita Mcgraw, MD 07/11/15 1058

## 2015-07-17 ENCOUNTER — Inpatient Hospital Stay: Admission: RE | Admit: 2015-07-17 | Payer: Medicare Other | Source: Ambulatory Visit

## 2015-07-17 NOTE — Pre-Procedure Instructions (Signed)
[facility]-HE FAXED OVER PTS MAR AND I CALLED DENNIS BACK AND WENT OVER DAY OF SURGERY INSTRUCTIONS WITH HIM.  FAXED INSTRUCTIONS OVER TO [facility] AS [facility]LL

## 2015-07-17 NOTE — Pre-Procedure Instructions (Signed)
CALLED DR Randa NgoPISCITELLO ABOUT ABNORMAL EKG 07-11-15 WHEN PT PRESENTED TO ED-READ OFF EKG FROM 02-27-16 THAT SHOWED ST & T WAVE ABNORMALILTY ,CONSIDER INFEREOLATERAL ISCHEMIA.  DR Salvadore FarberPISCITELLO WANTS MEDICAL CLEARANCE

## 2015-07-17 NOTE — Patient Instructions (Signed)
  Your procedure is scheduled on: 07-20-15 (THURSDAY) Report to [facility] SAME DAY SURGERY 2ND FLOOR To find out your arrival time please call [phone] between 1PM - 3PM on 07-19-15 [facility](WEDNESDAY)  Remember: Instructions that are not followed completely may result in serious medical risk, up to and including death, or upon the discretion of your surgeon and anesthesiologist your surgery may need to be rescheduled.    _X___ 1. Do not eat food or drink liquids after midnight. No gum chewing or hard candies.     _X___ 2. No Alcohol for 24 hours before or after surgery.   ____ 3. Bring all medications with you on the day of surgery if instructed.    _X___ 4. Notify your doctor if there is any change in your medical condition     (cold, fever, infections).     Do not wear jewelry, make-up, hairpins, clips or nail polish.  Do not wear lotions, powders, or perfumes. You may wear deodorant.  Do not shave 48 hours prior to surgery. Men may shave face and neck.  Do not bring valuables to the hospital.    [facility] any belongings or valuables.               Contacts, dentures or bridgework may not be worn into surgery.  Leave your suitcase in the car. After surgery it may be brought to your room.  For patients admitted to the hospital, discharge time is determined by your  treatment team.   Patients discharged the day of surgery will not be allowed to drive home.   Please read over the following fact sheets that you were given:      _X___ Take these medicines the morning of surgery with A SIP OF WATER:    1. LEVOTHYROXINE  2. AMLODIPINE  3.   4.  5.  6.  ____ Fleet Enema (as directed)   ____ Use CHG Soap as directed  ____ Use inhalers on the day of surgery  ____ Stop metformin 2 days prior to surgery    ____ Take 1/2 of usual insulin dose the night before surgery and none on the morning of surgery.   ____ Stop Coumadin/Plavix/aspirin  ____ Stop  Anti-inflammatories   ____ Stop supplements until after surgery.    ____ Bring C-Pap to the hospital.

## 2015-07-17 NOTE — Pre-Procedure Instructions (Signed)
CALLED Mercedes Powell AT [facility] AND LEFT MESSAGE FOR HER THAT PT IS NEEDING MED CLEARANCE AND THAT SHE WILL NEED TO SET PT UP WITH CLEARANCE. FAXED CLEARANCE REQUEST AND EKG OVER TO Mercedes Powell WITH FAX CONFIRMATION RECEIVED

## 2015-07-19 NOTE — Pre-Procedure Instructions (Signed)
Received medical clearance note from Dr Leota JacobsenM Miller-low risk-on chart

## 2015-07-20 ENCOUNTER — Ambulatory Visit: Payer: Medicare Other | Admitting: Certified Registered"

## 2015-07-20 ENCOUNTER — Encounter: Payer: Self-pay | Admitting: *Deleted

## 2015-07-20 ENCOUNTER — Ambulatory Visit
Admission: RE | Admit: 2015-07-20 | Discharge: 2015-07-20 | Disposition: A | Discharge status: Skilled Nursing Facility | Payer: Medicare Other | Source: Ambulatory Visit | Attending: Orthopedic Surgery | Admitting: Orthopedic Surgery

## 2015-07-20 ENCOUNTER — Encounter
Admission: RE | Disposition: A | Discharge status: Skilled Nursing Facility | Payer: Self-pay | Source: Ambulatory Visit | Attending: Orthopedic Surgery

## 2015-07-20 DIAGNOSIS — F419 Anxiety disorder, unspecified: Secondary | ICD-10-CM | POA: Insufficient documentation

## 2015-07-20 DIAGNOSIS — I10 Essential (primary) hypertension: Secondary | ICD-10-CM | POA: Diagnosis not present

## 2015-07-20 DIAGNOSIS — E039 Hypothyroidism, unspecified: Secondary | ICD-10-CM | POA: Insufficient documentation

## 2015-07-20 DIAGNOSIS — Z8249 Family history of ischemic heart disease and other diseases of the circulatory system: Secondary | ICD-10-CM | POA: Insufficient documentation

## 2015-07-20 DIAGNOSIS — I7300 Raynaud's syndrome without gangrene: Secondary | ICD-10-CM | POA: Diagnosis not present

## 2015-07-20 DIAGNOSIS — M79606 Pain in leg, unspecified: Secondary | ICD-10-CM | POA: Diagnosis not present

## 2015-07-20 DIAGNOSIS — F0390 Unspecified dementia without behavioral disturbance: Secondary | ICD-10-CM | POA: Insufficient documentation

## 2015-07-20 DIAGNOSIS — Z87891 Personal history of nicotine dependence: Secondary | ICD-10-CM | POA: Insufficient documentation

## 2015-07-20 DIAGNOSIS — Z79899 Other long term (current) drug therapy: Secondary | ICD-10-CM | POA: Insufficient documentation

## 2015-07-20 DIAGNOSIS — Z96641 Presence of right artificial hip joint: Secondary | ICD-10-CM | POA: Insufficient documentation

## 2015-07-20 DIAGNOSIS — G8929 Other chronic pain: Secondary | ICD-10-CM | POA: Diagnosis not present

## 2015-07-20 DIAGNOSIS — Z472 Encounter for removal of internal fixation device: Secondary | ICD-10-CM | POA: Insufficient documentation

## 2015-07-20 DIAGNOSIS — Z888 Allergy status to other drugs, medicaments and biological substances status: Secondary | ICD-10-CM | POA: Diagnosis not present

## 2015-07-20 DIAGNOSIS — Z90710 Acquired absence of both cervix and uterus: Secondary | ICD-10-CM | POA: Diagnosis not present

## 2015-07-20 HISTORY — PX: HARDWARE REMOVAL: SHX979

## 2015-07-20 LAB — CBC WITH DIFFERENTIAL/PLATELET
BASOS ABS: 0 10*3/uL (ref 0–0.1)
BASOS PCT: 1 %
Eosinophils Absolute: 0 10*3/uL (ref 0–0.7)
Eosinophils Relative: 1 %
HEMATOCRIT: 42.5 % (ref 35.0–47.0)
HEMOGLOBIN: 14.1 g/dL (ref 12.0–16.0)
LYMPHS PCT: 33 %
Lymphs Abs: 1.7 10*3/uL (ref 1.0–3.6)
MCH: 30.2 pg (ref 26.0–34.0)
MCHC: 33.2 g/dL (ref 32.0–36.0)
MCV: 91 fL (ref 80.0–100.0)
MONO ABS: 0.6 10*3/uL (ref 0.2–0.9)
MONOS PCT: 12 %
NEUTROS ABS: 2.7 10*3/uL (ref 1.4–6.5)
NEUTROS PCT: 53 %
PLATELETS: 266 10*3/uL (ref 150–440)
RBC: 4.67 MIL/uL (ref 3.80–5.20)
RDW: 14.6 % — ABNORMAL HIGH (ref 11.5–14.5)
WBC: 5.1 10*3/uL (ref 3.6–11.0)

## 2015-07-20 LAB — BASIC METABOLIC PANEL
ANION GAP: 9 (ref 5–15)
BUN: 17 mg/dL (ref 6–20)
CALCIUM: 10.3 mg/dL (ref 8.9–10.3)
CO2: 27 mmol/L (ref 22–32)
Chloride: 98 mmol/L — ABNORMAL LOW (ref 101–111)
Creatinine, Ser: 0.91 mg/dL (ref 0.44–1.00)
GFR calc non Af Amer: 56 mL/min — ABNORMAL LOW (ref >60)
GLUCOSE: 118 mg/dL — AB (ref 65–99)
POTASSIUM: 3.7 mmol/L (ref 3.5–5.1)
Sodium: 134 mmol/L — ABNORMAL LOW (ref 135–145)

## 2015-07-20 LAB — PROTIME-INR
INR: 1.06
Prothrombin Time: 14 seconds (ref 11.4–15.0)

## 2015-07-20 LAB — APTT: aPTT: 30 seconds (ref 24–36)

## 2015-07-20 SURGERY — REMOVAL, HARDWARE
Anesthesia: Monitor Anesthesia Care | Site: Arm Lower | Laterality: Right | Wound class: Clean

## 2015-07-20 MED ORDER — ONDANSETRON HCL 4 MG/2ML IJ SOLN: 4.0000 mg | Freq: Once | INTRAMUSCULAR | Status: DC | PRN

## 2015-07-20 MED ORDER — OXYCODONE HCL 5 MG PO TABS
5.0000 mg | ORAL_TABLET | ORAL | Status: DC | PRN
  Administered 2015-07-20: 5 mg via ORAL

## 2015-07-20 MED ORDER — PHENYLEPHRINE HCL 10 MG/ML IJ SOLN
INTRAMUSCULAR | Status: DC | PRN
  Administered 2015-07-20 (×2): 50 ug via INTRAVENOUS
  Administered 2015-07-20: 100 ug via INTRAVENOUS

## 2015-07-20 MED ORDER — NEOMYCIN-POLYMYXIN B GU 40-200000 IR SOLN
Status: DC | PRN
  Administered 2015-07-20: 2 mL

## 2015-07-20 MED ORDER — FAMOTIDINE 20 MG PO TABS
20.0000 mg | ORAL_TABLET | Freq: Once | ORAL | Status: AC
  Administered 2015-07-20: 20 mg via ORAL

## 2015-07-20 MED ORDER — ONDANSETRON HCL 4 MG/2ML IJ SOLN
INTRAMUSCULAR | Status: DC | PRN
  Administered 2015-07-20: 4 mg via INTRAVENOUS

## 2015-07-20 MED ORDER — BUPIVACAINE HCL (PF) 0.5 % IJ SOLN
INTRAMUSCULAR | Status: AC
  Filled 2015-07-20: qty 30

## 2015-07-20 MED ORDER — NEOMYCIN-POLYMYXIN B GU 40-200000 IR SOLN
Status: AC
  Filled 2015-07-20: qty 2

## 2015-07-20 MED ORDER — FENTANYL CITRATE (PF) 100 MCG/2ML IJ SOLN
25.0000 ug | INTRAMUSCULAR | Status: DC | PRN
  Administered 2015-07-20 (×4): 25 ug via INTRAVENOUS

## 2015-07-20 MED ORDER — ACETAMINOPHEN 10 MG/ML IV SOLN
INTRAVENOUS | Status: DC | PRN
  Administered 2015-07-20: 750 mg via INTRAVENOUS

## 2015-07-20 MED ORDER — LIDOCAINE HCL (CARDIAC) 20 MG/ML IV SOLN
INTRAVENOUS | Status: DC | PRN
  Administered 2015-07-20: 60 mg via INTRAVENOUS

## 2015-07-20 MED ORDER — ACETAMINOPHEN 10 MG/ML IV SOLN
INTRAVENOUS | Status: AC
  Filled 2015-07-20: qty 100

## 2015-07-20 MED ORDER — FENTANYL CITRATE (PF) 100 MCG/2ML IJ SOLN
INTRAMUSCULAR | Status: AC
  Filled 2015-07-20: qty 2

## 2015-07-20 MED ORDER — CEFAZOLIN SODIUM-DEXTROSE 2-4 GM/100ML-% IV SOLN
2.0000 g | INTRAVENOUS | Status: AC
  Administered 2015-07-20: 2 g via INTRAVENOUS

## 2015-07-20 MED ORDER — FAMOTIDINE 20 MG PO TABS
ORAL_TABLET | ORAL | Status: DC
Start: 2015-07-20 — End: 2015-07-20
  Filled 2015-07-20: qty 1

## 2015-07-20 MED ORDER — OXYCODONE HCL 5 MG PO TABS
ORAL_TABLET | ORAL | Status: AC
  Filled 2015-07-20: qty 1

## 2015-07-20 MED ORDER — FENTANYL CITRATE (PF) 100 MCG/2ML IJ SOLN
INTRAMUSCULAR | Status: DC | PRN
  Administered 2015-07-20 (×5): 25 ug via INTRAVENOUS

## 2015-07-20 MED ORDER — LACTATED RINGERS IV SOLN
INTRAVENOUS | Status: DC
  Administered 2015-07-20: 12:00:00 via INTRAVENOUS

## 2015-07-20 MED ORDER — CEFAZOLIN SODIUM-DEXTROSE 2-4 GM/100ML-% IV SOLN
INTRAVENOUS | Status: AC
  Filled 2015-07-20: qty 100

## 2015-07-20 MED ORDER — OXYCODONE HCL 5 MG PO TABS: 5.0000 mg | ORAL_TABLET | ORAL | Status: DC | PRN

## 2015-07-20 MED ORDER — PROPOFOL 10 MG/ML IV BOLUS
INTRAVENOUS | Status: DC | PRN
  Administered 2015-07-20: 70 mg via INTRAVENOUS

## 2015-07-20 MED ORDER — BUPIVACAINE HCL 0.5 % IJ SOLN
INTRAMUSCULAR | Status: DC | PRN
  Administered 2015-07-20: 8 mL

## 2015-07-20 SURGICAL SUPPLY — 40 items
BLADE SURG 15 STRL LF DISP TIS (BLADE) ×1 IMPLANT
BLADE SURG 15 STRL SS (BLADE) ×2
BNDG ESMARK 6X12 TAN STRL LF (GAUZE/BANDAGES/DRESSINGS) ×3 IMPLANT
CANISTER SUCT 1200ML W/VALVE (MISCELLANEOUS) ×3 IMPLANT
CAST PADDING 3X4FT ST 30246 (SOFTGOODS) ×2
CLOSURE WOUND 1/2 X4 (GAUZE/BANDAGES/DRESSINGS) ×1
DRAPE FLUOR MINI C-ARM 54X84 (DRAPES) ×3 IMPLANT
DRAPE INCISE IOBAN 66X45 STRL (DRAPES) ×3 IMPLANT
DRAPE U-SHAPE 47X51 STRL (DRAPES) ×3 IMPLANT
DURAPREP 26ML APPLICATOR (WOUND CARE) ×3 IMPLANT
ELECT REM PT RETURN 9FT ADLT (ELECTROSURGICAL) ×3
ELECTRODE REM PT RTRN 9FT ADLT (ELECTROSURGICAL) ×1 IMPLANT
GAUZE PETRO XEROFOAM 1X8 (MISCELLANEOUS) ×3 IMPLANT
GAUZE SPONGE 4X4 12PLY STRL (GAUZE/BANDAGES/DRESSINGS) ×3 IMPLANT
GLOVE BIOGEL PI IND STRL 9 (GLOVE) ×1 IMPLANT
GLOVE BIOGEL PI INDICATOR 9 (GLOVE) ×2
GLOVE SURG 9.0 ORTHO LTXF (GLOVE) ×3 IMPLANT
GOWN STRL REUS TWL 2XL XL LVL4 (GOWN DISPOSABLE) ×3 IMPLANT
GOWN STRL REUS W/ TWL LRG LVL3 (GOWN DISPOSABLE) ×1 IMPLANT
GOWN STRL REUS W/TWL LRG LVL3 (GOWN DISPOSABLE) ×2
KIT RM TURNOVER STRD PROC AR (KITS) ×3 IMPLANT
LABEL OR SOLS (LABEL) ×3 IMPLANT
NS IRRIG 1000ML POUR BTL (IV SOLUTION) ×3 IMPLANT
PACK EXTREMITY ARMC (MISCELLANEOUS) ×3 IMPLANT
PAD ABD DERMACEA PRESS 5X9 (GAUZE/BANDAGES/DRESSINGS) ×3 IMPLANT
PAD CAST CTTN 3X4 STRL (SOFTGOODS) ×1 IMPLANT
PAD CAST CTTN 4X4 STRL (SOFTGOODS) IMPLANT
PADDING CAST COTTON 4X4 STRL (SOFTGOODS)
SPLINT CAST 1 STEP 3X12 (MISCELLANEOUS) ×3 IMPLANT
SPLINT CAST 1 STEP 4X30 (MISCELLANEOUS) IMPLANT
SPONGE LAP 18X18 5 PK (GAUZE/BANDAGES/DRESSINGS) ×3 IMPLANT
STAPLER SKIN PROX 35W (STAPLE) ×3 IMPLANT
STOCKINETTE STRL 6IN 960660 (GAUZE/BANDAGES/DRESSINGS) ×3 IMPLANT
STRIP CLOSURE SKIN 1/2X4 (GAUZE/BANDAGES/DRESSINGS) ×2 IMPLANT
SUT ETHILON 3-0 FS-10 30 BLK (SUTURE) ×6
SUT VIC AB 2-0 SH 27 (SUTURE) ×2
SUT VIC AB 2-0 SH 27XBRD (SUTURE) ×1 IMPLANT
SUTURE EHLN 3-0 FS-10 30 BLK (SUTURE) ×2 IMPLANT
SYR 30ML LL (SYRINGE) ×3 IMPLANT
TAPE MICROPORE 2IN (TAPE) IMPLANT

## 2015-07-20 NOTE — Op Note (Signed)
07/20/2015  1:50 PM  PATIENT:  Mercedes Powell    PRE-OPERATIVE DIAGNOSIS:  V37.482L Colles' fracture of right radius with temporary internal hardware.  POST-OPERATIVE DIAGNOSIS:  Same  PROCEDURE:  HARDWARE REMOVAL, Right wrist/forearm  SURGEON:  Thornton Park, MD  ANESTHESIA:   General  PREOPERATIVE INDICATIONS:  Mercedes Powell is a  82 y.o. female with a diagnosis of distal both bone forearm fracture treated with internal spanning wrist plate. This plate was placed at [facility]. The fracture has healed radiographically and the plate needs to be removed now at this point to allow patient to regain wrist motion.  I discussed the risks and benefits of surgery. The risks include but are not limited to infection, bleeding requiring blood transfusion, nerve or blood vessel injury, joint stiffness or loss of motion, persistent pain, weakness or instability, malunion, nonunion and hardware failure and the need for further surgery. Medical risks include but are not limited to DVT and pulmonary embolism, myocardial infarction, stroke, pneumonia, respiratory failure and death. Patient understood these risks and wished to proceed.   OPERATIVE FINDINGS: Healed right distal radius fracture.  OPERATIVE PROCEDURE: Patient was met in the preoperative area with her family at the bedside. A preoperative H&P was performed. Consent was signed. Patient was marked over the right wrist with my initials and the word yes according the hospital's correct site of surgery protocol. She is brought to the operating room where she was placed supine on the operative table. She underwent general anesthesia. Her right upper extremity was placed on a hand table. A tourniquet was applied to her right upper extremity. Patient was prepped and draped in sterile fashion. A timeout was performed to verify the patient's name, date of birth, medical record number and correct site of surgery. Once all in attendance were in  agreement case began. Patient received IV prior to the onset of the case. Patient's right upper chamois was exsanguinated with an Esmarch and the tourniquet inflated to 250 mmHg. The tourniquet was inflated for 68 minutes.  Patient had her distal and proximal incisions reopened with a # 15 blade plate was easily identified subcutaneously in both locations. Soft tissue was carefully dissected off the plate with the Metzenbaum scissor and electrocautery. The distal 3 locking screws were then removed without complication. The proximal screws were then removed using a 2.7*driver. This is from the distal fibular site. A Freer elevator was then used to mobilize the soft tissue off the plate throughout the forearm. The patient was then removed through the distal incision. FluoroScan images were taken to confirm all hardware had been removed. The fracture was deemed to be well-healed. There was no visible motion with stress testing at the fracture. The wounds were copiously irrigated.  The incisions were closed with 4-0 nylon. A dry sterile dressing was applied including Steri-Strips Xeroform 4 x 4's and Webb roll. A 3 inch splint was placed volarly and the forearm Ace wrap. I was scrubbed and present for the entire case and all sharp and instrument counts were correct at the conclusion the case. I spoke with the patient's family in the postop consultation room to let them know the case was performed without complication and the patient was stable in recovery room.   Thornton Park, MD

## 2015-07-20 NOTE — Transfer of Care (Signed)
Immediate Anesthesia Transfer of Care Note  Patient: Mercedes EdwardsBetty J Powell  Procedure(s) Performed: Procedure(s): HARDWARE REMOVAL (Right)  Patient Location: PACU  Anesthesia Type:General  Level of Consciousness: sedated and responds to stimulation  Airway & Oxygen Therapy: Patient Spontanous Breathing and Patient connected to face mask oxygen  Post-op Assessment: Report given to RN and Post -op Vital signs reviewed and stable  Post vital signs: Reviewed and stable  Last Vitals:  Filed Vitals:   07/20/15 0956  BP: 155/69  Pulse: 83  Temp: 36.5 C  Resp: 16    Complications: No apparent anesthesia complications

## 2015-07-20 NOTE — H&P (Signed)
PREOPERATIVE H&P  Chief Complaint: S52.531A Colles' fracture of right radius, init for clos fx  HPI: Mercedes Powell is a 82 y.o. female who presents for preoperative history and physical with a diagnosis of S52.531A Colles' fracture of right radius, init for clos fx. Patient had a spanning plate placed to treat her fracture.   The fracture has healed and the plate needs to be removed so the pateint can regain motion.      Past Medical History  Diagnosis Date  . Hypertension   . Anxiety   . Chronic leg pain   . HTN (hypertension)   . Hypothyroid   . Raynaud phenomenon   . Dementia    Past Surgical History  Procedure Laterality Date  . Appendectomy    . Abdominal hysterectomy    . Breast lump removal    . Hip arthroplasty Right 04/11/2015    Procedure: ARTHROPLASTY BIPOLAR HIP (HEMIARTHROPLASTY);  Surgeon: Juanell FairlyKevin Dajai Wahlert, MD;  Location: [facility] ORS;  Service: Orthopedics;  Laterality: Right;   Social History   Social History  . Marital Status: Married    Spouse Name: N/A  . Number of Children: N/A  . Years of Education: N/A   Social History Main Topics  . Smoking status: Former Games developermoker  . Smokeless tobacco: None  . Alcohol Use: No  . Drug Use: No  . Sexual Activity: Not Asked   Other Topics Concern  . None   Social History Narrative   Family History  Problem Relation Age of Onset  . Hypertension     Allergies  Allergen Reactions  . Ace Inhibitors Swelling  . Galantamine Diarrhea  . Maxidex [Dexamethasone] Other (See Comments)    Reaction:  Unknown   . Maxzide [Hydrochlorothiazide W-Triamterene] Other (See Comments)    Reaction:  Hyponatremia   . Procardia [Nifedipine] Swelling  . Zoloft [Sertraline Hcl] Diarrhea   Prior to Admission medications   Medication Sig Start Date End Date Taking? Authorizing Provider  acetaminophen (TYLENOL) 325 MG tablet Take 650 mg by mouth as needed for mild pain, moderate pain or fever.   Yes Historical Provider, MD  amLODipine  (NORVASC) 5 MG tablet Take 1 tablet (5 mg total) by mouth once. Patient taking differently: Take 5 mg by mouth every morning.  02/27/15  Yes Gayla DossEryka A Gayle, MD  barrier cream (NON-SPECIFIED) CREA Apply 1 application topically as needed. Apply to red areas on buttocks sacrum as needed   Yes Historical Provider, MD  Calcium 500 MG CHEW Chew 500 mg by mouth daily.   Yes Historical Provider, MD  Cholecalciferol (VITAMIN D) 2000 UNITS CAPS Take 2,000 Units by mouth daily.   Yes Historical Provider, MD  iron polysaccharides (NIFEREX) 150 MG capsule Take 150 mg by mouth daily.   Yes Historical Provider, MD  levothyroxine (SYNTHROID, LEVOTHROID) 75 MCG tablet Take 75 mcg by mouth daily before breakfast.   Yes Historical Provider, MD  Multiple Vitamin (MULTIVITAMIN WITH MINERALS) TABS tablet Take 1 tablet by mouth daily.   Yes Historical Provider, MD  polyvinyl alcohol-povidone (HYPOTEARS) 1.4-0.6 % ophthalmic solution Place 1 drop into both eyes 4 (four) times daily.   Yes Historical Provider, MD     Positive ROS: All other systems have been reviewed and were otherwise negative with the exception of those mentioned in the HPI and as above.  Physical Exam: General: Alert, no acute distress Cardiovascular: Regular rate and rhythm, no murmurs rubs or gallops.  No pedal edema Respiratory: Clear to auscultation bilaterally, no  wheezes rales or rhonchi. No cyanosis, no use of accessory musculature GI: No organomegaly, abdomen is soft and non-tender nondistended with positive bowel sounds. Skin: Skin intact, no lesions within the operative field. Neurologic: Sensation intact distally Psychiatric: Patient is competent for consent with normal mood and affect Lymphatic: No cervical lymphadenopathy  MUSCULOSKELETAL: Right wrist:  Healed incisions.  No deformity.  Minimal flexion and extension.  Plate is palpable under the skin.  Patient can actively flex and extend digits.  Fingers are well perfused.  She has  intact sensation to light touch throughout.  Assessment: S52.531A Colles' fracture of right radius, init for clos fx  Plan: Plan for Procedure(s): HARDWARE REMOVAL, RIGHT WRIST  I have spoken with the hand surgeon at [facility] who fixed the patient's wrist. He is recommended removal of the plate to the patient can try and regain some motion of her wrist. The patient is in a nursing facility here at [facility]. I have agreed to remove the plate for the patient's her she does not need to return to [facility]. I discussed the details of the operation with the patient and her son who is her legal guardian.  I also discussed the risks and benefits of surgery. The risks include but are not limited to infection, bleeding requiring blood transfusion, nerve or blood vessel injury, joint stiffness or loss of motion, persistent pain, weakness or instability, malunion, nonunion and hardware failure and the need for further surgery. Medical risks include but are not limited to DVT and pulmonary embolism, myocardial infarction, stroke, pneumonia, respiratory failure and death. Patient  and her son understood these risks and wished to proceed.     Juanell Fairly, MD   07/20/2015 12:08 PM

## 2015-07-20 NOTE — Anesthesia Preprocedure Evaluation (Addendum)
Anesthesia Evaluation  Patient identified by MRN, date of birth, ID band Patient awake    Reviewed: Allergy & Precautions, H&P , NPO status , Patient's Chart, lab work & pertinent test results, reviewed documented beta blocker date and time   Airway Mallampati: II  TM Distance: >3 FB Neck ROM: full    Dental no notable dental hx. (+) Teeth Intact   Pulmonary neg pulmonary ROS, former smoker,    Pulmonary exam normal breath sounds clear to auscultation       Cardiovascular Exercise Tolerance: Good hypertension, negative cardio ROS Normal cardiovascular exam Rhythm:regular Rate:Normal     Neuro/Psych negative neurological ROS  negative psych ROS   GI/Hepatic negative GI ROS, Neg liver ROS,   Endo/Other  negative endocrine ROSdiabetes  Renal/GU negative Renal ROS  negative genitourinary   Musculoskeletal   Abdominal   Peds  Hematology negative hematology ROS (+)   Anesthesia Other Findings   Reproductive/Obstetrics negative OB ROS                            Anesthesia Physical Anesthesia Plan  ASA: III  Anesthesia Plan: MAC   Post-op Pain Management:    Induction:   Airway Management Planned:   Additional Equipment:   Intra-op Plan:   Post-operative Plan:   Informed Consent: I have reviewed the patients History and Physical, chart, labs and discussed the procedure including the risks, benefits and alternatives for the proposed anesthesia with the patient or authorized representative who has indicated his/her understanding and acceptance.     Plan Discussed with: CRNA  Anesthesia Plan Comments:        Anesthesia Quick Evaluation

## 2015-07-20 NOTE — Anesthesia Procedure Notes (Signed)
Procedure Name: LMA Insertion Performed by: Casey BurkittHOANG, Joslyn Ramos Pre-anesthesia Checklist: Patient identified, Emergency Drugs available, Suction available, Patient being monitored and Timeout performed Patient Re-evaluated:Patient Re-evaluated prior to inductionOxygen Delivery Method: Circle system utilized Preoxygenation: Pre-oxygenation with 100% oxygen Intubation Type: IV induction Ventilation: Mask ventilation without difficulty LMA: LMA inserted LMA Size: 3.5 Number of attempts: 2 Tube secured with: Tape Dental Injury: Teeth and Oropharynx as per pre-operative assessment  Comments: Placed 3.0 LMA and did not seat well, changed over to 3.5 airQ LMA and was able to get better seal

## 2015-07-21 NOTE — Anesthesia Postprocedure Evaluation (Signed)
Anesthesia Post Note  Patient: Mercedes EdwardsBetty J Zeoli  Procedure(s) Performed: Procedure(s) (LRB): HARDWARE REMOVAL (Right)  Patient location during evaluation: PACU Anesthesia Type: General Level of consciousness: awake and alert Pain management: pain level controlled Vital Signs Assessment: post-procedure vital signs reviewed and stable Respiratory status: spontaneous breathing, nonlabored ventilation, respiratory function stable and patient connected to nasal cannula oxygen Cardiovascular status: blood pressure returned to baseline and stable Postop Assessment: no signs of nausea or vomiting Anesthetic complications: no    Last Vitals:  Filed Vitals:   07/20/15 1522 07/20/15 1547  BP: 157/61 148/66  Pulse: 84 87  Temp: 35.6 C   Resp: 14 14    Last Pain:  Filed Vitals:   07/20/15 1550  PainSc: 4                  Mercedes EdwardsJames G Adams

## 2016-12-17 ENCOUNTER — Ambulatory Visit
Admission: RE | Admit: 2016-12-17 | Discharge: 2016-12-17 | Disposition: A | Discharge status: Home / Self Care | Payer: Medicare Other | Source: Ambulatory Visit | Attending: Internal Medicine | Admitting: Internal Medicine

## 2016-12-17 ENCOUNTER — Other Ambulatory Visit: Payer: Self-pay | Admitting: Internal Medicine

## 2016-12-17 DIAGNOSIS — R0600 Dyspnea, unspecified: Secondary | ICD-10-CM | POA: Diagnosis not present

## 2016-12-17 DIAGNOSIS — G319 Degenerative disease of nervous system, unspecified: Secondary | ICD-10-CM | POA: Insufficient documentation

## 2016-12-17 DIAGNOSIS — I639 Cerebral infarction, unspecified: Secondary | ICD-10-CM

## 2016-12-17 DIAGNOSIS — A419 Sepsis, unspecified organism: Secondary | ICD-10-CM | POA: Diagnosis not present

## 2016-12-17 DIAGNOSIS — I6782 Cerebral ischemia: Secondary | ICD-10-CM

## 2016-12-19 ENCOUNTER — Emergency Department: Payer: Medicare Other

## 2016-12-19 ENCOUNTER — Encounter: Payer: Self-pay | Admitting: Emergency Medicine

## 2016-12-19 ENCOUNTER — Inpatient Hospital Stay
Admission: EM | Admit: 2016-12-19 | Discharge: 2016-12-24 | DRG: 871 | Disposition: A | Discharge status: Skilled Nursing Facility | Payer: Medicare Other | Attending: Internal Medicine | Admitting: Internal Medicine

## 2016-12-19 DIAGNOSIS — I129 Hypertensive chronic kidney disease with stage 1 through stage 4 chronic kidney disease, or unspecified chronic kidney disease: Secondary | ICD-10-CM | POA: Diagnosis present

## 2016-12-19 DIAGNOSIS — Z515 Encounter for palliative care: Secondary | ICD-10-CM | POA: Diagnosis not present

## 2016-12-19 DIAGNOSIS — Z96641 Presence of right artificial hip joint: Secondary | ICD-10-CM | POA: Diagnosis present

## 2016-12-19 DIAGNOSIS — N179 Acute kidney failure, unspecified: Secondary | ICD-10-CM | POA: Diagnosis not present

## 2016-12-19 DIAGNOSIS — I214 Non-ST elevation (NSTEMI) myocardial infarction: Secondary | ICD-10-CM | POA: Diagnosis present

## 2016-12-19 DIAGNOSIS — E872 Acidosis: Secondary | ICD-10-CM | POA: Diagnosis present

## 2016-12-19 DIAGNOSIS — Z90710 Acquired absence of both cervix and uterus: Secondary | ICD-10-CM | POA: Diagnosis not present

## 2016-12-19 DIAGNOSIS — G309 Alzheimer's disease, unspecified: Secondary | ICD-10-CM | POA: Diagnosis not present

## 2016-12-19 DIAGNOSIS — G9341 Metabolic encephalopathy: Secondary | ICD-10-CM | POA: Diagnosis present

## 2016-12-19 DIAGNOSIS — R6520 Severe sepsis without septic shock: Secondary | ICD-10-CM | POA: Diagnosis present

## 2016-12-19 DIAGNOSIS — Z8249 Family history of ischemic heart disease and other diseases of the circulatory system: Secondary | ICD-10-CM

## 2016-12-19 DIAGNOSIS — R0600 Dyspnea, unspecified: Secondary | ICD-10-CM | POA: Diagnosis present

## 2016-12-19 DIAGNOSIS — I4891 Unspecified atrial fibrillation: Secondary | ICD-10-CM | POA: Diagnosis present

## 2016-12-19 DIAGNOSIS — Z66 Do not resuscitate: Secondary | ICD-10-CM | POA: Diagnosis present

## 2016-12-19 DIAGNOSIS — Z888 Allergy status to other drugs, medicaments and biological substances status: Secondary | ICD-10-CM | POA: Diagnosis not present

## 2016-12-19 DIAGNOSIS — R7989 Other specified abnormal findings of blood chemistry: Secondary | ICD-10-CM

## 2016-12-19 DIAGNOSIS — M79606 Pain in leg, unspecified: Secondary | ICD-10-CM | POA: Diagnosis present

## 2016-12-19 DIAGNOSIS — R778 Other specified abnormalities of plasma proteins: Secondary | ICD-10-CM

## 2016-12-19 DIAGNOSIS — Z7189 Other specified counseling: Secondary | ICD-10-CM | POA: Diagnosis not present

## 2016-12-19 DIAGNOSIS — J690 Pneumonitis due to inhalation of food and vomit: Secondary | ICD-10-CM | POA: Diagnosis present

## 2016-12-19 DIAGNOSIS — Z87891 Personal history of nicotine dependence: Secondary | ICD-10-CM

## 2016-12-19 DIAGNOSIS — Z7982 Long term (current) use of aspirin: Secondary | ICD-10-CM | POA: Diagnosis not present

## 2016-12-19 DIAGNOSIS — F0280 Dementia in other diseases classified elsewhere without behavioral disturbance: Secondary | ICD-10-CM | POA: Diagnosis not present

## 2016-12-19 DIAGNOSIS — G8929 Other chronic pain: Secondary | ICD-10-CM | POA: Diagnosis present

## 2016-12-19 DIAGNOSIS — A419 Sepsis, unspecified organism: Secondary | ICD-10-CM | POA: Diagnosis not present

## 2016-12-19 DIAGNOSIS — E039 Hypothyroidism, unspecified: Secondary | ICD-10-CM | POA: Diagnosis present

## 2016-12-19 DIAGNOSIS — N170 Acute kidney failure with tubular necrosis: Secondary | ICD-10-CM | POA: Diagnosis present

## 2016-12-19 DIAGNOSIS — I7300 Raynaud's syndrome without gangrene: Secondary | ICD-10-CM | POA: Diagnosis present

## 2016-12-19 DIAGNOSIS — N183 Chronic kidney disease, stage 3 (moderate): Secondary | ICD-10-CM | POA: Diagnosis present

## 2016-12-19 DIAGNOSIS — R748 Abnormal levels of other serum enzymes: Secondary | ICD-10-CM | POA: Diagnosis not present

## 2016-12-19 DIAGNOSIS — W19XXXA Unspecified fall, initial encounter: Secondary | ICD-10-CM

## 2016-12-19 DIAGNOSIS — R4182 Altered mental status, unspecified: Secondary | ICD-10-CM

## 2016-12-19 LAB — CBC WITH DIFFERENTIAL/PLATELET
BASOS ABS: 0 10*3/uL (ref 0–0.1)
BASOS PCT: 0 %
EOS ABS: 0 10*3/uL (ref 0–0.7)
EOS PCT: 0 %
HCT: 40.8 % (ref 35.0–47.0)
Hemoglobin: 13.7 g/dL (ref 12.0–16.0)
Lymphocytes Relative: 4 %
Lymphs Abs: 0.8 10*3/uL — ABNORMAL LOW (ref 1.0–3.6)
MCH: 31.8 pg (ref 26.0–34.0)
MCHC: 33.6 g/dL (ref 32.0–36.0)
MCV: 94.8 fL (ref 80.0–100.0)
MONO ABS: 1.2 10*3/uL — AB (ref 0.2–0.9)
MONOS PCT: 7 %
Neutro Abs: 15.4 10*3/uL — ABNORMAL HIGH (ref 1.4–6.5)
Neutrophils Relative %: 89 %
PLATELETS: 231 10*3/uL (ref 150–440)
RBC: 4.3 MIL/uL (ref 3.80–5.20)
RDW: 13.8 % (ref 11.5–14.5)
WBC: 17.3 10*3/uL — ABNORMAL HIGH (ref 3.6–11.0)

## 2016-12-19 LAB — URINALYSIS, COMPLETE (UACMP) WITH MICROSCOPIC
BILIRUBIN URINE: NEGATIVE
Bacteria, UA: NONE SEEN
Glucose, UA: 50 mg/dL — AB
HGB URINE DIPSTICK: NEGATIVE
KETONES UR: 5 mg/dL — AB
LEUKOCYTES UA: NEGATIVE
NITRITE: NEGATIVE
PH: 5 (ref 5.0–8.0)
Protein, ur: 100 mg/dL — AB
Specific Gravity, Urine: 1.017 (ref 1.005–1.030)
Squamous Epithelial / LPF: NONE SEEN

## 2016-12-19 LAB — TROPONIN I
TROPONIN I: 17.94 ng/mL — AB (ref <0.03)
TROPONIN I: 45.62 ng/mL — AB (ref <0.03)
Troponin I: 22.24 ng/mL (ref <0.03)

## 2016-12-19 LAB — LACTIC ACID, PLASMA
LACTIC ACID, VENOUS: 3.2 mmol/L — AB (ref 0.5–1.9)
LACTIC ACID, VENOUS: 3 mmol/L — AB (ref 0.5–1.9)

## 2016-12-19 LAB — MRSA PCR SCREENING: MRSA by PCR: NEGATIVE

## 2016-12-19 LAB — COMPREHENSIVE METABOLIC PANEL
ALT: 37 U/L (ref 14–54)
ANION GAP: 15 (ref 5–15)
AST: 109 U/L — ABNORMAL HIGH (ref 15–41)
Albumin: 3.6 g/dL (ref 3.5–5.0)
Alkaline Phosphatase: 72 U/L (ref 38–126)
BILIRUBIN TOTAL: 1.4 mg/dL — AB (ref 0.3–1.2)
BUN: 62 mg/dL — ABNORMAL HIGH (ref 6–20)
CALCIUM: 9.8 mg/dL (ref 8.9–10.3)
CO2: 20 mmol/L — ABNORMAL LOW (ref 22–32)
Chloride: 107 mmol/L (ref 101–111)
Creatinine, Ser: 2.28 mg/dL — ABNORMAL HIGH (ref 0.44–1.00)
GFR, EST AFRICAN AMERICAN: 21 mL/min — AB (ref >60)
GFR, EST NON AFRICAN AMERICAN: 18 mL/min — AB (ref >60)
Glucose, Bld: 271 mg/dL — ABNORMAL HIGH (ref 65–99)
POTASSIUM: 4.1 mmol/L (ref 3.5–5.1)
Sodium: 142 mmol/L (ref 135–145)
TOTAL PROTEIN: 7.2 g/dL (ref 6.5–8.1)

## 2016-12-19 LAB — GLUCOSE, CAPILLARY
GLUCOSE-CAPILLARY: 179 mg/dL — AB (ref 65–99)
GLUCOSE-CAPILLARY: 221 mg/dL — AB (ref 65–99)
GLUCOSE-CAPILLARY: 293 mg/dL — AB (ref 65–99)
GLUCOSE-CAPILLARY: 319 mg/dL — AB (ref 65–99)

## 2016-12-19 LAB — HEPARIN LEVEL (UNFRACTIONATED): HEPARIN UNFRACTIONATED: 0.38 [IU]/mL (ref 0.30–0.70)

## 2016-12-19 LAB — BRAIN NATRIURETIC PEPTIDE: B NATRIURETIC PEPTIDE 5: 3288 pg/mL — AB (ref 0.0–100.0)

## 2016-12-19 LAB — PROTIME-INR
INR: 1.52
PROTHROMBIN TIME: 18.2 s — AB (ref 11.4–15.2)

## 2016-12-19 LAB — APTT: APTT: 115 s — AB (ref 24–36)

## 2016-12-19 LAB — PROCALCITONIN: Procalcitonin: 1.6 ng/mL

## 2016-12-19 MED ORDER — METOPROLOL TARTRATE 5 MG/5ML IV SOLN
5.0000 mg | INTRAVENOUS | Status: AC | PRN
  Administered 2016-12-19 – 2016-12-20 (×3): 5 mg via INTRAVENOUS
  Filled 2016-12-19 (×3): qty 5

## 2016-12-19 MED ORDER — INSULIN ASPART 100 UNIT/ML ~~LOC~~ SOLN
0.0000 [IU] | SUBCUTANEOUS | Status: DC
  Administered 2016-12-19: 7 [IU] via SUBCUTANEOUS
  Administered 2016-12-19: 3 [IU] via SUBCUTANEOUS
  Administered 2016-12-20 (×2): 2 [IU] via SUBCUTANEOUS
  Administered 2016-12-20 (×2): 1 [IU] via SUBCUTANEOUS
  Administered 2016-12-20 (×2): 2 [IU] via SUBCUTANEOUS
  Administered 2016-12-21: 1 [IU] via SUBCUTANEOUS
  Administered 2016-12-21: 3 [IU] via SUBCUTANEOUS
  Administered 2016-12-21 (×2): 2 [IU] via SUBCUTANEOUS
  Administered 2016-12-21: 3 [IU] via SUBCUTANEOUS
  Administered 2016-12-22: 2 [IU] via SUBCUTANEOUS
  Administered 2016-12-22: 1 [IU] via SUBCUTANEOUS
  Administered 2016-12-22: 3 [IU] via SUBCUTANEOUS
  Administered 2016-12-22 – 2016-12-23 (×2): 2 [IU] via SUBCUTANEOUS
  Administered 2016-12-23: 1 [IU] via SUBCUTANEOUS
  Administered 2016-12-23 (×2): 2 [IU] via SUBCUTANEOUS
  Administered 2016-12-23: 1 [IU] via SUBCUTANEOUS
  Administered 2016-12-23: 2 [IU] via SUBCUTANEOUS
  Administered 2016-12-23: 3 [IU] via SUBCUTANEOUS
  Administered 2016-12-24: 1 [IU] via SUBCUTANEOUS
  Administered 2016-12-24 (×2): 2 [IU] via SUBCUTANEOUS
  Administered 2016-12-24: 1 [IU] via SUBCUTANEOUS
  Filled 2016-12-19 (×28): qty 1

## 2016-12-19 MED ORDER — ONDANSETRON HCL 4 MG PO TABS: 4.0000 mg | ORAL_TABLET | Freq: Four times a day (QID) | ORAL | Status: DC | PRN

## 2016-12-19 MED ORDER — ASPIRIN 300 MG RE SUPP
300.0000 mg | Freq: Once | RECTAL | Status: AC
  Administered 2016-12-19: 300 mg via RECTAL

## 2016-12-19 MED ORDER — SODIUM CHLORIDE 0.9 % IV BOLUS (SEPSIS)
1000.0000 mL | Freq: Once | INTRAVENOUS | Status: AC
  Administered 2016-12-19: 1000 mL via INTRAVENOUS

## 2016-12-19 MED ORDER — HEPARIN (PORCINE) IN NACL 100-0.45 UNIT/ML-% IJ SOLN
12.0000 [IU]/kg/h | INTRAMUSCULAR | Status: DC
  Administered 2016-12-19: 12 [IU]/kg/h via INTRAVENOUS
  Filled 2016-12-19: qty 250

## 2016-12-19 MED ORDER — ALBUTEROL SULFATE (2.5 MG/3ML) 0.083% IN NEBU
2.5000 mg | INHALATION_SOLUTION | Freq: Four times a day (QID) | RESPIRATORY_TRACT | Status: DC | PRN
  Filled 2016-12-19: qty 3

## 2016-12-19 MED ORDER — ACETAMINOPHEN 650 MG RE SUPP
650.0000 mg | Freq: Four times a day (QID) | RECTAL | Status: DC | PRN
  Filled 2016-12-19: qty 1

## 2016-12-19 MED ORDER — DILTIAZEM HCL 25 MG/5ML IV SOLN
10.0000 mg | Freq: Once | INTRAVENOUS | Status: DC
  Filled 2016-12-19: qty 5

## 2016-12-19 MED ORDER — HEPARIN BOLUS VIA INFUSION
3000.0000 [IU] | Freq: Once | INTRAVENOUS | Status: AC
  Administered 2016-12-19: 3000 [IU] via INTRAVENOUS
  Filled 2016-12-19: qty 3000

## 2016-12-19 MED ORDER — POLYETHYLENE GLYCOL 3350 17 G PO PACK: 17.0000 g | PACK | Freq: Every day | ORAL | Status: DC | PRN

## 2016-12-19 MED ORDER — SODIUM CHLORIDE 0.9 % IV SOLN
INTRAVENOUS | Status: AC
  Administered 2016-12-19 – 2016-12-20 (×2): via INTRAVENOUS

## 2016-12-19 MED ORDER — ONDANSETRON HCL 4 MG/2ML IJ SOLN: 4.0000 mg | Freq: Four times a day (QID) | INTRAMUSCULAR | Status: DC | PRN

## 2016-12-19 MED ORDER — PIPERACILLIN-TAZOBACTAM 3.375 G IVPB 30 MIN
INTRAVENOUS | Status: AC
  Administered 2016-12-19: 3.375 g via INTRAVENOUS
  Filled 2016-12-19: qty 50

## 2016-12-19 MED ORDER — HEPARIN (PORCINE) IN NACL 100-0.45 UNIT/ML-% IJ SOLN: 10.0000 [IU]/kg/h | Freq: Once | INTRAMUSCULAR | Status: DC

## 2016-12-19 MED ORDER — ACETAMINOPHEN 325 MG PO TABS: 650.0000 mg | ORAL_TABLET | Freq: Four times a day (QID) | ORAL | Status: DC | PRN

## 2016-12-19 MED ORDER — MORPHINE SULFATE (PF) 2 MG/ML IV SOLN
1.0000 mg | INTRAVENOUS | Status: DC | PRN
  Administered 2016-12-19 – 2016-12-20 (×2): 1 mg via INTRAVENOUS
  Filled 2016-12-19 (×2): qty 1

## 2016-12-19 MED ORDER — VANCOMYCIN HCL IN DEXTROSE 1-5 GM/200ML-% IV SOLN
1000.0000 mg | Freq: Once | INTRAVENOUS | Status: AC
  Administered 2016-12-19: 1000 mg via INTRAVENOUS
  Filled 2016-12-19: qty 200

## 2016-12-19 MED ORDER — DEXTROSE 5 % IV SOLN
5.0000 mg/h | INTRAVENOUS | Status: DC
  Administered 2016-12-19 – 2016-12-20 (×2): 5 mg/h via INTRAVENOUS
  Filled 2016-12-19 (×2): qty 100

## 2016-12-19 MED ORDER — HEPARIN SODIUM (PORCINE) 5000 UNIT/ML IJ SOLN: 60.0000 [IU]/kg | Freq: Once | INTRAMUSCULAR | Status: DC

## 2016-12-19 MED ORDER — PIPERACILLIN-TAZOBACTAM 3.375 G IVPB 30 MIN
3.3750 g | Freq: Once | INTRAVENOUS | Status: AC
  Administered 2016-12-19: 3.375 g via INTRAVENOUS

## 2016-12-19 MED ORDER — ASPIRIN 300 MG RE SUPP
RECTAL | Status: AC
  Administered 2016-12-19: 300 mg via RECTAL
  Filled 2016-12-19: qty 1

## 2016-12-19 MED ORDER — ASPIRIN 300 MG RE SUPP
300.0000 mg | Freq: Every day | RECTAL | Status: DC
  Administered 2016-12-20 – 2016-12-24 (×5): 300 mg via RECTAL
  Filled 2016-12-19 (×4): qty 1

## 2016-12-19 MED ORDER — PIPERACILLIN-TAZOBACTAM 3.375 G IVPB
3.3750 g | Freq: Two times a day (BID) | INTRAVENOUS | Status: DC
  Administered 2016-12-19 – 2016-12-21 (×5): 3.375 g via INTRAVENOUS
  Filled 2016-12-19 (×5): qty 50

## 2016-12-19 NOTE — Progress Notes (Signed)
eLink Physician-Brief Progress Note Patient Name: Mercedes EdwardsBetty J Powell DOB: 1928-11-09 MRN: [id number]   Date of Service  12/19/2016  HPI/Events of Note  New patient admitted with possible sepsis. Elevated lactic acid. Currently in atrial fibrillation with rapid ventricular response. Broad-spectrum antibiotics started. Patient currently on diltiazem infusion. Awaiting further family arrival before deciding on limits to care. Nursing staff at bedside.   eICU Interventions  1. Continuing to trend lactic acid 2. Continuing titration of diltiazem infusion 3. Continuous telemetry monitoring 4. Continuing broad-spectrum and Robaxin 5. Continuing plan of care as per admitting service and consulting intensivist      Intervention Category Evaluation Type: New Patient Evaluation  Lawanda CousinsJennings Nancy Manuele 12/19/2016, 3:28 PM

## 2016-12-19 NOTE — Progress Notes (Signed)
ANTICOAGULATION CONSULT NOTE  Pharmacy Consult for Heparin Indication: chest pain/ACS  Allergies  Allergen Reactions  . Ace Inhibitors Swelling  . Galantamine Diarrhea  . Maxidex [Dexamethasone] Other (See Comments)    Reaction:  Unknown   . Maxzide [Hydrochlorothiazide W-Triamterene] Other (See Comments)    Reaction:  Hyponatremia   . Procardia [Nifedipine] Swelling  . Zoloft [Sertraline Hcl] Diarrhea    Patient Measurements: Height: 5\' 4"  (162.6 cm) Weight: 110 lb (49.9 kg) IBW/kg (Calculated) : 54.7  Vital Signs: Temp Source: Oral (08/30 1145) BP: 124/86 (08/30 1430) Pulse Rate: 133 (08/30 1430)  Labs:  Recent Labs  12/19/16 1145  HGB 13.7  HCT 40.8  PLT 231  CREATININE 2.28*  TROPONINI 17.94*    Estimated Creatinine Clearance: 14 mL/min (A) (by C-G formula based on SCr of 2.28 mg/dL (H)).   Medical History: Past Medical History:  Diagnosis Date  . Anxiety   . Chronic leg pain   . Dementia   . HTN (hypertension)   . Hypertension   . Hypothyroid   . Raynaud phenomenon     Assessment: 83 y/o F admitted with NSTEMI and no PTA anticoagulant use per RN.   Goal of Therapy:  Heparin level 0.3-0.7 units/ml Monitor platelets by anticoagulation protocol: Yes   Plan:  Give 3000 units bolus x 1 Start heparin infusion at 600 units/hr Check anti-Xa level in 8 hours and daily while on heparin Continue to monitor H&H and platelets  Luisa Harthristy, Monserrate Blaschke D 12/19/2016,2:42 PM

## 2016-12-19 NOTE — Progress Notes (Signed)
Spoke with extended family members to arrive from out of town regarding Mercedes Powell and her current medical condition. This was a request made by RN taking care of patient. Family did not want to discuss with ICU attending for reason unknown. Nurse practitioner reported to RN she does not know details about the patient and would not feel comfortable discussing and when I went up to the ICU to see the another patient I discussed at length with family. We'll continue medical management, palliative care consultation placed. Readdress CODE STATUS patient is a no code DO NOT RESUSCITATE. Family does understand patient is critically ill and may not survive all hospital stay.  Time spent 20 mins

## 2016-12-19 NOTE — ED Provider Notes (Addendum)
[facility] Emergency Department Provider Note   ____________________________________________   First MD Initiated Contact with Patient 12/19/16 1144     (approximate)  I have reviewed the triage vital signs and the nursing notes.   HISTORY  Chief Complaint Altered Mental Status History limited by patient not speaking  HPI Mercedes Powell is a 83 y.o. female who comes from nursing home with EMS. I spoke with the patient slightly that she is not speaking much nursing home relate history by EMS and to the nurse's notes spoke with the patient's family. Patient had a major change in her abilities about 2 weeks ago was unable to walk stop speaking is much she had a negative CT of the head 3 days ago today she was sweaty to Breathing very hard much as fast not speaking much at all night eating much at all the speaking and eating and been going on for at least 3 days. In the emergency room and O2 sats are 90-91%respiratory rate is at least 30. Patient has a relatively high blood pressure 153/88 heart rate is in the 160s she really won't say much except for that nothing is hurting her. She will not grip my hands she will not follow my finger with her eyes  Past Medical History:  Diagnosis Date  . Anxiety   . Chronic leg pain   . Dementia   . HTN (hypertension)   . Hypertension   . Hypothyroid   . Raynaud phenomenon     Patient Active Problem List   Diagnosis Date Noted  . Hip fracture (HCC) 04/10/2015    Past Surgical History:  Procedure Laterality Date  . ABDOMINAL HYSTERECTOMY    . APPENDECTOMY    . breast lump removal    . HARDWARE REMOVAL Right 07/20/2015   Procedure: HARDWARE REMOVAL;  Surgeon: Juanell FairlyKevin Krasinski, MD;  Location: [facility] ORS;  Service: Orthopedics;  Laterality: Right;  . HIP ARTHROPLASTY Right 04/11/2015   Procedure: ARTHROPLASTY BIPOLAR HIP (HEMIARTHROPLASTY);  Surgeon: Juanell FairlyKevin Krasinski, MD;  Location: [facility] ORS;  Service: Orthopedics;   Laterality: Right;    Prior to Admission medications   Medication Sig Start Date End Date Taking? Authorizing Provider  acetaminophen (TYLENOL) 325 MG tablet Take 650 mg by mouth as needed for mild pain, moderate pain or fever.    [provider]  amLODipine (NORVASC) 5 MG tablet Take 1 tablet (5 mg total) by mouth once. Patient taking differently: Take 5 mg by mouth every morning.  02/27/15   Gayla DossGayle, Eryka A, MD  barrier cream (NON-SPECIFIED) CREA Apply 1 application topically as needed. Apply to red areas on buttocks sacrum as needed    [provider]  Calcium 500 MG CHEW Chew 500 mg by mouth daily.    [provider]  Cholecalciferol (VITAMIN D) 2000 UNITS CAPS Take 2,000 Units by mouth daily.    [provider]  iron polysaccharides (NIFEREX) 150 MG capsule Take 150 mg by mouth daily.    [provider]  levothyroxine (SYNTHROID, LEVOTHROID) 75 MCG tablet Take 75 mcg by mouth daily before breakfast.    [provider]  Multiple Vitamin (MULTIVITAMIN WITH MINERALS) TABS tablet Take 1 tablet by mouth daily.    [provider]  oxyCODONE (OXY IR/ROXICODONE) 5 MG immediate release tablet Take 1 tablet (5 mg total) by mouth every 4 (four) hours as needed for severe pain. 07/20/15   Juanell FairlyKrasinski, Kevin, MD  polyvinyl alcohol-povidone (HYPOTEARS) 1.4-0.6 % ophthalmic solution Place 1  drop into both eyes 4 (four) times daily.    [provider]    Allergies Ace inhibitors; Galantamine; Maxidex [dexamethasone]; Maxzide [hydrochlorothiazide w-triamterene]; Procardia [nifedipine]; and Zoloft [sertraline hcl]  Family History  Problem Relation Age of Onset  . Hypertension Unknown     Social History Social History  Substance Use Topics  . Smoking status: Former Games developer  . Smokeless tobacco: Not on file  . Alcohol use No    Review of Systems  Unable to obtain ____________________________________________   PHYSICAL  EXAM:  VITAL SIGNS: ED Triage Vitals  Enc Vitals Group     BP 12/19/16 1145 (!) 153/88     Pulse Rate 12/19/16 1145 (!) 168     Resp 12/19/16 1145 (!) 30     Temp --      Temp Source 12/19/16 1145 Oral     SpO2 12/19/16 1145 92 %     Weight 12/19/16 1141 110 lb (49.9 kg)     Height 12/19/16 1141 5\' 4"  (1.626 m)     Head Circumference --      Peak Flow --      Pain Score --      Pain Loc --      Pain Edu? --      Excl. in GC? --     Constitutional: Alert To Make an tachycardic but not currently sweaty Eyes: Conjunctivae are normal. PERRL. patient seems to have a left gaze preference Head: Atraumatic. Nose: No congestion/rhinnorhea. Mouth/Throat: Mucous membranes are moist.  Oropharynx non-erythematous. Neck: No stridor.  Cardiovascular: Increased rate, irregularly irregular rhythm. Grossly normal heart sounds.  Good peripheral circulation. Respiratory: Increased respiratory effort and rate.  No retractions. Lungs CTAB. Gastrointestinal: Soft and nontender. No distention. No abdominal bruits. No CVA tenderness. Musculoskeletal: No lower extremity tenderness nor edema.  No joint effusions. Neurologic: Patient is a left facial droop which family reports is no Skin:  Skin is warm, dry and intact. No rash noted.   ____________________________________________   LABS (all labs ordered are listed, but only abnormal results are displayed)  Labs Reviewed  URINALYSIS, COMPLETE (UACMP) WITH MICROSCOPIC - Abnormal; Notable for the following:       Result Value   Color, Urine AMBER (*)    APPearance HAZY (*)    Glucose, UA 50 (*)    Ketones, ur 5 (*)    Protein, ur 100 (*)    All other components within normal limits  COMPREHENSIVE METABOLIC PANEL - Abnormal; Notable for the following:    CO2 20 (*)    Glucose, Bld 271 (*)    BUN 62 (*)    Creatinine, Ser 2.28 (*)    AST 109 (*)    Total Bilirubin 1.4 (*)    GFR calc non Af Amer 18 (*)    GFR calc Af Amer 21 (*)    All  other components within normal limits  BRAIN NATRIURETIC PEPTIDE - Abnormal; Notable for the following:    B Natriuretic Peptide 3,288.0 (*)    All other components within normal limits  TROPONIN I - Abnormal; Notable for the following:    Troponin I 17.94 (*)    All other components within normal limits  LACTIC ACID, PLASMA - Abnormal; Notable for the following:    Lactic Acid, Venous 3.2 (*)    All other components within normal limits  CBC WITH DIFFERENTIAL/PLATELET - Abnormal; Notable for the following:    WBC 17.3 (*)    Neutro Abs 15.4 (*)  Lymphs Abs 0.8 (*)    Monocytes Absolute 1.2 (*)    All other components within normal limits  CULTURE, BLOOD (ROUTINE X 2)  CULTURE, BLOOD (ROUTINE X 2)  CULTURE, BLOOD (ROUTINE X 2)  CULTURE, BLOOD (ROUTINE X 2)  LACTIC ACID, PLASMA  URINALYSIS, ROUTINE W REFLEX MICROSCOPIC  I-STAT CG4 LACTIC ACID, ED  I-STAT CG4 LACTIC ACID, ED   ____________________________________________  EKG  EKG shows A. fib with rapid ventricular response at rate of 160 normal axis ST-T changes which are probably rate related ____________________________________________  RADIOLOGY CT of the head read as negative by radiology Chest x-ray looks like it could be a pneumonia to me compared with prior films ____________________________________________   PROCEDURES  Procedure(s) performed:   Procedures  Critical Care performed:   ____________________________________________   INITIAL IMPRESSION / ASSESSMENT AND PLAN / ED COURSE  Pertinent labs & imaging results that were available during my care of the patient were reviewed by me and considered in my medical decision making (see chart for details).  We'll treat the patient for sepsis her heart rate is down to 120s now blood pressure still good after metoprolol we'll give her fluids, antibiotics and aspirin for her troponin of 17. We'll talk to the hospitalist about further treatment     Hospice  wishes me to call the cardiologist. Discussed with Dr. Juliann Pares will also give her heparin. Patient's rectal exam was done is Hemoccult-negative. Also discussed with patient's sister and son. They reports she never wanted to be on machines. We will make her a no code. They concur.  ____________________________________________   FINAL CLINICAL IMPRESSION(S) / ED DIAGNOSES  Final diagnoses:  Altered mental status, unspecified altered mental status type  Elevated troponin  Sepsis, due to unspecified organism [facility])      NEW MEDICATIONS STARTED DURING THIS VISIT:  New Prescriptions   No medications on file     Note:  This document was prepared using Dragon voice recognition software and may include unintentional dictation errors.    Arnaldo Natal, MD 12/19/16 1254    Arnaldo Natal, MD 12/19/16 [phone]

## 2016-12-19 NOTE — Progress Notes (Addendum)
CRITICAL VALUE ALERT  Critical Value:  3.0 sec's pause  Date & Time Notied:  12/19/16 @2049   Provider Notified: Magda PaganiniBincy V NP  Orders Received/Actions taken: Caredizem gtt pause for now.

## 2016-12-19 NOTE — Progress Notes (Signed)
Pharmacy Antibiotic Note  Yevette EdwardsBetty J Achterberg is a 83 y.o. female admitted on 12/19/2016 with NSTEMI and sepsis.  Pharmacy has been consulted for vancomycin and Zosyn dosing.  Plan: Would check a MRSA PCR depending on suspected source of infection. Would also check PCT.   Zosyn 3.375 g EI q 12 hours 8 hours after initial dose.   Vancomycin 1000 mg iv once to serve as a "loading dose." Due to ARF will check a random level with AM labs and dose by levels for now.   Height: 5\' 4"  (162.6 cm) Weight: 110 lb (49.9 kg) IBW/kg (Calculated) : 54.7  No data recorded.   Recent Labs Lab 12/19/16 1145  WBC 17.3*  CREATININE 2.28*  LATICACIDVEN 3.2*    Estimated Creatinine Clearance: 14 mL/min (A) (by C-G formula based on SCr of 2.28 mg/dL (H)).    Allergies  Allergen Reactions  . Ace Inhibitors Swelling  . Galantamine Diarrhea  . Maxidex [Dexamethasone] Other (See Comments)    Reaction:  Unknown   . Maxzide [Hydrochlorothiazide W-Triamterene] Other (See Comments)    Reaction:  Hyponatremia   . Procardia [Nifedipine] Swelling  . Zoloft [Sertraline Hcl] Diarrhea    Antimicrobials this admission: Zosyn 8/30 >>  vancomycin 8/30 >>   Dose adjustments this admission:   Microbiology results: 8/30 BCx: sent  Thank you for allowing pharmacy to be a part of this patient's care.  Luisa HartChristy, Holden Draughon D 12/19/2016 2:32 PM

## 2016-12-19 NOTE — Consult Note (Signed)
[facility] Consultation    SYNOPSIS   The patient is a 83 year old female with possible recent CVA symptoms, now presents with sepsis, atrial fibrillation with rapid ventricular rate,acute kidney injury, elevated troponin, possible right aspiration pneumonia. DNR.  ASSESSMENT/PLAN   -Atrial fibrillation with RVR. Elevated troponin.  Continue Cardizem, heparin, cardiology has been consulted. Trend troponins.  AKI Continue IVF.   Sepsis with lactic acidosis.  Continue IVF.  Empiric abx.   Aspiration pneumonia.  Possible RLL infiltrate may indicate aspiration pneumonia.  Abx.   Advance Care Planning Currently one brother at bedside, the patient's other 3 sons are on their way to make further care decisions, possible comfort measures.   MAJOR EVENTS/TEST RESULTS: -Admitted 12/19/16  Best Practices  DVT Prophylaxis: On heparin.  GI Prophylaxis: --  ---------------------------------------  ---------------------------------------   Name: Mercedes Powell MRN: [id number] DOB: [birth year]    ADMISSION DATE:  12/19/2016 CONSULTATION DATE:  12/19/16  REFERRING MD :  Dr. Allena Katz  CHIEF COMPLAINT:  Tachycardia   HISTORY OF PRESENT ILLNESS:    He patient is  lethargic and cannot provide history or review of systems.history is obtained from the chart and from the family at the bedside She lives at a assisted living facility. It was noted that her mental status, decrease significantly approximately 2 weeks ago, she has been speaking less since then.she apparently had a CT scan approximately 3 days ago which was negative. She is brought in today due to diminished mental status. On evaluation in the ED, it was found that her heart rate was 168, in atrial fibrillation.her glucose was elevated at 271 creatinine is elevated at 2.28, troponin is elevated at 17.94, lactic acid was elevated at 3.2.  I personally reviewed. Chest x-ray images, there is increased right  lower lobe infiltrate. This may be consistent with pneumonia  PAST MEDICAL HISTORY :  Past Medical History:  Diagnosis Date  . Anxiety   . Chronic leg pain   . Dementia   . HTN (hypertension)   . Hypertension   . Hypothyroid   . Raynaud phenomenon    Past Surgical History:  Procedure Laterality Date  . ABDOMINAL HYSTERECTOMY    . APPENDECTOMY    . breast lump removal    . HARDWARE REMOVAL Right 07/20/2015   Procedure: HARDWARE REMOVAL;  Surgeon: Juanell Fairly, MD;  Location: [facility];  Service: Orthopedics;  Laterality: Right;  . HIP ARTHROPLASTY Right 04/11/2015   Procedure: ARTHROPLASTY BIPOLAR HIP (HEMIARTHROPLASTY);  Surgeon: Juanell Fairly, MD;  Location: [facility];  Service: Orthopedics;  Laterality: Right;   Prior to Admission medications   Medication Sig Start Date End Date Taking? Authorizing Provider  acetaminophen (TYLENOL) 325 MG tablet Take 650 mg by mouth as needed for mild pain, moderate pain or fever.   Yes [provider]  aspirin EC 81 MG tablet Take 81 mg by mouth daily.   Yes [provider]  Calcium 500 MG CHEW Chew 500 mg by mouth daily.   Yes [provider]  Cholecalciferol (VITAMIN D) 2000 UNITS CAPS Take 2,000 Units by mouth daily.   Yes [provider]  hydrALAZINE (APRESOLINE) 50 MG tablet Take 50 mg by mouth 2 (two) times daily.   Yes [provider]  levothyroxine (SYNTHROID, LEVOTHROID) 75 MCG tablet Take 75 mcg by mouth daily before breakfast.   Yes [provider]  Multiple Vitamin (MULTIVITAMIN WITH MINERALS) TABS tablet Take 1 tablet by mouth daily.   Yes [provider]  polyvinyl alcohol-povidone (HYPOTEARS) 1.4-0.6 % ophthalmic solution Place 1 drop into both eyes 4 (four) times daily.   Yes [provider]  amLODipine (NORVASC) 5 MG tablet Take 1 tablet (5 mg total) by mouth once. Patient not taking: Reported on 12/19/2016 02/27/15   Gayla DossGayle, Eryka A, MD  oxyCODONE (OXY  IR/ROXICODONE) 5 MG immediate release tablet Take 1 tablet (5 mg total) by mouth every 4 (four) hours as needed for severe pain. Patient not taking: Reported on 12/19/2016 07/20/15   Juanell FairlyKrasinski, Kevin, MD   Allergies  Allergen Reactions  . Ace Inhibitors Swelling  . Galantamine Diarrhea  . Maxidex [Dexamethasone] Other (See Comments)    Reaction:  Unknown   . Maxzide [Hydrochlorothiazide W-Triamterene] Other (See Comments)    Reaction:  Hyponatremia   . Procardia [Nifedipine] Swelling  . Zoloft [Sertraline Hcl] Diarrhea    FAMILY HISTORY:  Family History  Problem Relation Age of Onset  . Hypertension Unknown    SOCIAL HISTORY:  reports that she has quit smoking. She does not have any smokeless tobacco history on file. She reports that she does not drink alcohol or use drugs.  REVIEW OF SYSTEMS:   could not be obtained due to diminished mental status   VITAL SIGNS: Pulse Rate:  [39-168] 133 (08/30 1430) Resp:  [25-30] 25 (08/30 1430) BP: (124-153)/(86-100) 124/86 (08/30 1430) SpO2:  [92 %-99 %] 96 % (08/30 1430) Weight:  [110 lb (49.9 kg)] 110 lb (49.9 kg) (08/30 1141) HEMODYNAMICS:   VENTILATOR SETTINGS:   INTAKE / OUTPUT:  Intake/Output Summary (Last 24 hours) at 12/19/16 1445 Last data filed at 12/19/16 1338  Gross per 24 hour  Intake               50 ml  Output                0 ml  Net               50 ml    Physical Examination:   VS: BP 124/86   Pulse (!) 133   Resp (!) 25   Ht 5\' 4"  (1.626 m)   Wt 110 lb (49.9 kg)   SpO2 96%   BMI 18.88 kg/m   General Appearance: lethargic Neuro:without focal findings, mental statuseduced does not follow commands. HEENT: PERRLA, EOM intact, no ptosis, no other lesions noticed;  Pulmonary: normal breath sounds., diaphragmatic excursion normal. CardiovascularNormal S1,S2.  No m/r/g.    Abdomen: Benign, Soft, non-tender, No masses, hepatosplenomegaly, No lymphadenopathy Renal:  No costovertebral tenderness  GU:  Not  performed at this time. Endoc: No evident thyromegaly, no signs of acromegaly. Skin:   warm, no rashes, no ecchymosis  Extremities: normal, no cyanosis, clubbing, no edema, warm with normal capillary refill.    LABS: Reviewed   LABORATORY PANEL:   CBC  Recent Labs Lab 12/19/16 1145  WBC 17.3*  HGB 13.7  HCT 40.8  PLT 231    Chemistries   Recent Labs Lab 12/19/16 1145  NA 142  K 4.1  CL 107  CO2 20*  GLUCOSE 271*  BUN 62*  CREATININE 2.28*  CALCIUM 9.8  AST 109*  ALT 37  ALKPHOS 72  BILITOT 1.4*    No results for input(s): GLUCAP in the last 168 hours. No results for input(s): PHART, PCO2ART, PO2ART in the last 168 hours.  Recent Labs Lab 12/19/16 1145  AST 109*  ALT 37  ALKPHOS 72  BILITOT 1.4*  ALBUMIN 3.6  Cardiac Enzymes  Recent Labs Lab 12/19/16 1145  TROPONINI 17.94*    RADIOLOGY:  Ct Head Wo Contrast  Result Date: 12/19/2016 CLINICAL DATA:  Altered mental status for the past 3 days. No known injury. EXAM: CT HEAD WITHOUT CONTRAST TECHNIQUE: Contiguous axial images were obtained from the base of the skull through the vertex without intravenous contrast. COMPARISON:  12/17/2016. FINDINGS: Brain: Diffusely enlarged ventricles and subarachnoid spaces. Patchy white matter low density in both cerebral hemispheres. Stable bilateral basal ganglia and left thalamic lacunar infarcts. No intracranial hemorrhage, mass lesion or CT evidence of acute infarction. Vascular: No hyperdense vessel or unexpected calcification. Skull: Normal. Negative for fracture or focal lesion. Sinuses/Orbits: Small bilateral ethmoid sinus retention cysts. Status post bilateral cataract extraction. Other: None. IMPRESSION: 1. No acute abnormality. 2. Stable marked diffuse cerebral atrophy and moderate cerebellar atrophy. 3. Stable extensive chronic small vessel white matter ischemic changes in both cerebral hemispheres and old lacunar infarcts, as described above.  Electronically Signed   By: Beckie Salts M.D.   On: 12/19/2016 12:48   Ct Head Wo Contrast  Result Date: 12/17/2016 CLINICAL DATA:  Altered mental status.  Patient leaning to the left. EXAM: CT HEAD WITHOUT CONTRAST TECHNIQUE: Contiguous axial images were obtained from the base of the skull through the vertex without intravenous contrast. COMPARISON:  07/11/2015. FINDINGS: Brain: Chronic atrophy with moderate sulcal and ventricular prominence. Moderate degree of chronic small vessel ischemic disease is identified in the periventricular and subcortical white matter. No large vascular territory infarction is noted. No intra-axial mass nor extra-axial fluid collections. Vascular: Mild atherosclerosis of the carotid siphons. No hyperdense vessels. Skull: Negative for fracture or focal lesion. Sinuses/Orbits: No acute finding. Other: None IMPRESSION: 1. Stable atrophy with chronic moderate small vessel ischemic disease of periventricular and subcortical white matter. 2. No acute intracranial abnormality is identified. Electronically Signed   By: Tollie Eth M.D.   On: 12/17/2016 15:08   Dg Chest Portable 1 View  Result Date: 12/19/2016 CLINICAL DATA:  Altered mental status.  Tachypnea.  Ex-smoker. EXAM: PORTABLE CHEST 1 VIEW COMPARISON:  05/05/2015. FINDINGS: Interval mildly enlarged cardiac silhouette with increased prominence of the interstitial markings. Normal vasculature. No pleural fluid is seen. Unremarkable bones. IMPRESSION: Interval mild cardiomegaly and mild interstitial pulmonary edema. Electronically Signed   By: Beckie Salts M.D.   On: 12/19/2016 12:49       --Deep Nicholos Johns, MD.  Board Certified in Internal Medicine, Pulmonary Medicine, Critical Care Medicine, and Sleep Medicine.  ICU Pager [phone] [place] [facility] Office Number: [phone]  Santiago Glad, M.D.  Billy Fischer, M.D   12/19/2016, 2:45 PM

## 2016-12-19 NOTE — H&P (Signed)
[facility] [place] [facility]   PATIENT NAME: Mercedes Powell    MR#:  [id number]  DATE OF BIRTH:  04/14/1929  DATE OF ADMISSION:  12/19/2016  PRIMARY CARE PHYSICIAN: Danella Penton, MD   REQUESTING/REFERRING PHYSICIAN: Dr. Juliette Alcide  CHIEF COMPLAINT:   Altered mental status.  Patient has history of dementia and does not communicate. Discussed with the patient's family was present in the ER along with ER M.D. HISTORY OF PRESENT ILLNESS:  Mercedes Powell  is a 83 y.o. female with a known history of chronic dementia,anxiety, hypertension, hypothyroidism comes to the emergency room from home place a copy by family members. According to the family ( sister and daughter-in-law) patient has been overall declining for last 4 weeks. She has been less communicative and not ambulating and a poor appetite. She was seen 2 days ago at primary care physician's office and a CT head was done to rule out stroke. CT head was negative. Patient comes in today with altered mental status emergency room was found to have elevated white count of 17,000 lactic acid of 3.2 BNP of 3300 and troponin of 17. She was also found to be in acute renal failure. Patient is being admitted for further evaluation and management of acute non-Q-wave MI, rapid A. Fib with RVR, sepsis, acute renal failure  PAST MEDICAL HISTORY:   Past Medical History:  Diagnosis Date  . Anxiety   . Chronic leg pain   . Dementia   . HTN (hypertension)   . Hypertension   . Hypothyroid   . Raynaud phenomenon     PAST SURGICAL HISTOIRY:   Past Surgical History:  Procedure Laterality Date  . ABDOMINAL HYSTERECTOMY    . APPENDECTOMY    . breast lump removal    . HARDWARE REMOVAL Right 07/20/2015   Procedure: HARDWARE REMOVAL;  Surgeon: Juanell Fairly, MD;  Location: [facility];  Service: Orthopedics;  Laterality: Right;  . HIP ARTHROPLASTY Right 04/11/2015   Procedure: ARTHROPLASTY BIPOLAR HIP (HEMIARTHROPLASTY);   Surgeon: Juanell Fairly, MD;  Location: [facility];  Service: Orthopedics;  Laterality: Right;    SOCIAL HISTORY:   Social History  Substance Use Topics  . Smoking status: Former Games developer  . Smokeless tobacco: Not on file  . Alcohol use No    FAMILY HISTORY:   Family History  Problem Relation Age of Onset  . Hypertension Unknown     DRUG ALLERGIES:   Allergies  Allergen Reactions  . Ace Inhibitors Swelling  . Galantamine Diarrhea  . Maxidex [Dexamethasone] Other (See Comments)    Reaction:  Unknown   . Maxzide [Hydrochlorothiazide W-Triamterene] Other (See Comments)    Reaction:  Hyponatremia   . Procardia [Nifedipine] Swelling  . Zoloft [Sertraline Hcl] Diarrhea    REVIEW OF SYSTEMS:  Review of Systems  Unable to perform ROS: Critical illness     MEDICATIONS AT HOME:   Prior to Admission medications   Medication Sig Start Date End Date Taking? Authorizing Provider  acetaminophen (TYLENOL) 325 MG tablet Take 650 mg by mouth as needed for mild pain, moderate pain or fever.   Yes [provider]  aspirin EC 81 MG tablet Take 81 mg by mouth daily.   Yes [provider]  Calcium 500 MG CHEW Chew 500 mg by mouth daily.   Yes [provider]  Cholecalciferol (VITAMIN D) 2000 UNITS CAPS Take 2,000 Units by mouth daily.   Yes [provider]  hydrALAZINE (APRESOLINE) 50 MG tablet  Take 50 mg by mouth 2 (two) times daily.   Yes [provider]  levothyroxine (SYNTHROID, LEVOTHROID) 75 MCG tablet Take 75 mcg by mouth daily before breakfast.   Yes [provider]  Multiple Vitamin (MULTIVITAMIN WITH MINERALS) TABS tablet Take 1 tablet by mouth daily.   Yes [provider]  polyvinyl alcohol-povidone (HYPOTEARS) 1.4-0.6 % ophthalmic solution Place 1 drop into both eyes 4 (four) times daily.   Yes [provider]  amLODipine (NORVASC) 5 MG tablet Take 1 tablet (5 mg total) by mouth once. Patient not taking:  Reported on 12/19/2016 02/27/15   Gayla Doss, MD  oxyCODONE (OXY IR/ROXICODONE) 5 MG immediate release tablet Take 1 tablet (5 mg total) by mouth every 4 (four) hours as needed for severe pain. Patient not taking: Reported on 12/19/2016 07/20/15   Juanell Fairly, MD      VITAL SIGNS:  Blood pressure (!) 153/88, pulse (!) 168, resp. rate (!) 30, height 5\' 4"  (1.626 m), weight 49.9 kg (110 lb), SpO2 92 %.  PHYSICAL EXAMINATION:  GENERAL:  83 y.o.-year-old patient lying in the bed with no acute distress. Limited exam secondary to patient's chronic dementia and nonverbal EYES: Pupils equal, round, reactive to light and accommodation. No scleral icterus. Extraocular muscles intact.  HEENT: Head atraumatic, normocephalic. Oropharynx and nasopharynx clear.  NECK:  Supple, no jugular venous distention. No thyroid enlargement, no tenderness.  LUNGS: Normal breath sounds bilaterally, no wheezing, rales,rhonchi or crepitation. No use of accessory muscles of respiration.  CARDIOVASCULAR: S1, S2 normal. No murmurs, rubs, or gallops. Tachycardia irregularly irregular heart rhythm ABDOMEN: Soft, nontender, nondistended. Bowel sounds present. No organomegaly or mass.  EXTREMITIES: No pedal edema, cyanosis, or clubbing.  NEUROLOGIC: unable to assessPSYCHIATRIC: Tpatient awakens on or before, and Howard nonverbalSKIN: No obvious rash, lesion, or ulcer.   LABORATORY PANEL:   CBC  Recent Labs Lab 12/19/16 1145  WBC 17.3*  HGB 13.7  HCT 40.8  PLT 231   ------------------------------------------------------------------------------------------------------------------  Chemistries   Recent Labs Lab 12/19/16 1145  NA 142  K 4.1  CL 107  CO2 20*  GLUCOSE 271*  BUN 62*  CREATININE 2.28*  CALCIUM 9.8  AST 109*  ALT 37  ALKPHOS 72  BILITOT 1.4*   ------------------------------------------------------------------------------------------------------------------  Cardiac Enzymes  Recent  Labs Lab 12/19/16 1145  TROPONINI 17.94*   ------------------------------------------------------------------------------------------------------------------  RADIOLOGY:  Ct Head Wo Contrast  Result Date: 12/19/2016 CLINICAL DATA:  Altered mental status for the past 3 days. No known injury. EXAM: CT HEAD WITHOUT CONTRAST TECHNIQUE: Contiguous axial images were obtained from the base of the skull through the vertex without intravenous contrast. COMPARISON:  12/17/2016. FINDINGS: Brain: Diffusely enlarged ventricles and subarachnoid spaces. Patchy white matter low density in both cerebral hemispheres. Stable bilateral basal ganglia and left thalamic lacunar infarcts. No intracranial hemorrhage, mass lesion or CT evidence of acute infarction. Vascular: No hyperdense vessel or unexpected calcification. Skull: Normal. Negative for fracture or focal lesion. Sinuses/Orbits: Small bilateral ethmoid sinus retention cysts. Status post bilateral cataract extraction. Other: None. IMPRESSION: 1. No acute abnormality. 2. Stable marked diffuse cerebral atrophy and moderate cerebellar atrophy. 3. Stable extensive chronic small vessel white matter ischemic changes in both cerebral hemispheres and old lacunar infarcts, as described above. Electronically Signed   By: Beckie Salts M.D.   On: 12/19/2016 12:48   Ct Head Wo Contrast  Result Date: 12/17/2016 CLINICAL DATA:  Altered mental status.  Patient leaning to the left. EXAM: CT HEAD WITHOUT CONTRAST TECHNIQUE:  Contiguous axial images were obtained from the base of the skull through the vertex without intravenous contrast. COMPARISON:  07/11/2015. FINDINGS: Brain: Chronic atrophy with moderate sulcal and ventricular prominence. Moderate degree of chronic small vessel ischemic disease is identified in the periventricular and subcortical white matter. No large vascular territory infarction is noted. No intra-axial mass nor extra-axial fluid collections. Vascular: Mild  atherosclerosis of the carotid siphons. No hyperdense vessels. Skull: Negative for fracture or focal lesion. Sinuses/Orbits: No acute finding. Other: None IMPRESSION: 1. Stable atrophy with chronic moderate small vessel ischemic disease of periventricular and subcortical white matter. 2. No acute intracranial abnormality is identified. Electronically Signed   By: Tollie Ethavid  Kwon M.D.   On: 12/17/2016 15:08   Dg Chest Portable 1 View  Result Date: 12/19/2016 CLINICAL DATA:  Altered mental status.  Tachypnea.  Ex-smoker. EXAM: PORTABLE CHEST 1 VIEW COMPARISON:  05/05/2015. FINDINGS: Interval mildly enlarged cardiac silhouette with increased prominence of the interstitial markings. Normal vasculature. No pleural fluid is seen. Unremarkable bones. IMPRESSION: Interval mild cardiomegaly and mild interstitial pulmonary edema. Electronically Signed   By: Beckie SaltsSteven  Reid M.D.   On: 12/19/2016 12:49    EKG:   A. Fib with RVR IMPRESSION AND PLAN:   Iva LentoBetty Arlotta  is a 83 y.o. female with a known history of chronic dementia,anxiety, hypertension, hypothyroidism comes to the emergency room from home place a copy by family members. According to the family ( sister and daughter-in-law) patient has been overall declining for last 4 weeks. She has been less communicative and not ambulating and a poor appetite.   1. Altered mental status/acute encephalopathy -appears metabolic along with worsening dementia -CT head negative other than chronic changes  2. Sepsis -Patient presented with tachycardia and elevated white count of 17,000 and lactic acid of 3.2 -Source undetermined -IV Zosyn and vancomycin -follow-up blood culture urine culture and white count -IV fluids at low ratefor now due to sepsis  3. Non-Q-wave MI -No history of CAD in the past however very strong family history of CAD -Medical management for now -Aspirin per rectal -Patient was seen by Dr. Juliann Paresallwood in the emergency room -We'll treat  conservatively with IV heparin drip  4. Rapid A. Fib with RVR--new onset -IV diltiazem drip -Echo of the heart  5. Acute renal failure secondary to poor by mouth intake, ATN from sepsis -IV fluids -1 nephrotoxins -Monitor I's and O's -Consider nephrology consultation is numbers worsen  6. Dementia -Progressive decline for last 4 weeks according to family poor appetite non-conversive and does not ambulate anymore  I had a long discussion with the patient's sister and brother-in-law and daughter-in The would like to consider the patient comfortable and pain-free. However they're waiting for 3 brothers and a son to come from out of town.  Palliative care consultation  Family understands patient has a poor prognosis but not survive hospital stay.  All the records are reviewed and case discussed with ED provider. Management plans discussed with the patient, family and they are in agreement.  CODE STATUS: DO NOT RESUSCITATE discussed with family  TOTAL criticalTIME TAKING CARE OF THIS PATIENT: 60minutes.    Trenita Hulme M.D on 12/19/2016 at 2:13 PM  Between 7am to 6pm - Pager - [phone]  After 6pm go to [email or web address] - password [email or web address] [facility]  [facility]  Office  [phone]  CC: Primary care physician; Danella PentonMiller, Mark F, MD

## 2016-12-19 NOTE — ED Notes (Signed)
Patient transported to CT 

## 2016-12-19 NOTE — Progress Notes (Signed)
CRITICAL VALUE ALERT  Critical Value:  Trop Level 44.62  Date & Time Notied:  12/19/16 @ 2306  Provider Notified: Magda PaganiniBincy V NP  Orders Received/Actions taken: no new orders

## 2016-12-19 NOTE — Progress Notes (Signed)
[facility] notified of critical toponin and lactic acid. Trudee KusterBrandi R Mansfield

## 2016-12-20 ENCOUNTER — Inpatient Hospital Stay: Payer: Medicare Other

## 2016-12-20 ENCOUNTER — Encounter: Payer: Self-pay | Admitting: Family Medicine

## 2016-12-20 ENCOUNTER — Inpatient Hospital Stay
Admit: 2016-12-20 | Discharge: 2016-12-20 | Disposition: A | Discharge status: Home / Self Care | Payer: Medicare Other | Attending: Internal Medicine | Admitting: Internal Medicine

## 2016-12-20 DIAGNOSIS — G309 Alzheimer's disease, unspecified: Secondary | ICD-10-CM

## 2016-12-20 DIAGNOSIS — R778 Other specified abnormalities of plasma proteins: Secondary | ICD-10-CM

## 2016-12-20 DIAGNOSIS — F0280 Dementia in other diseases classified elsewhere without behavioral disturbance: Secondary | ICD-10-CM

## 2016-12-20 DIAGNOSIS — R748 Abnormal levels of other serum enzymes: Secondary | ICD-10-CM

## 2016-12-20 DIAGNOSIS — A419 Sepsis, unspecified organism: Principal | ICD-10-CM

## 2016-12-20 DIAGNOSIS — Z515 Encounter for palliative care: Secondary | ICD-10-CM

## 2016-12-20 DIAGNOSIS — Z7189 Other specified counseling: Secondary | ICD-10-CM

## 2016-12-20 DIAGNOSIS — I214 Non-ST elevation (NSTEMI) myocardial infarction: Secondary | ICD-10-CM

## 2016-12-20 DIAGNOSIS — R7989 Other specified abnormal findings of blood chemistry: Secondary | ICD-10-CM

## 2016-12-20 DIAGNOSIS — N179 Acute kidney failure, unspecified: Secondary | ICD-10-CM

## 2016-12-20 LAB — CBC
HCT: 35.4 % (ref 35.0–47.0)
HEMOGLOBIN: 12.2 g/dL (ref 12.0–16.0)
MCH: 32.8 pg (ref 26.0–34.0)
MCHC: 34.6 g/dL (ref 32.0–36.0)
MCV: 95 fL (ref 80.0–100.0)
Platelets: 190 10*3/uL (ref 150–440)
RBC: 3.73 MIL/uL — AB (ref 3.80–5.20)
RDW: 13.8 % (ref 11.5–14.5)
WBC: 10.8 10*3/uL (ref 3.6–11.0)

## 2016-12-20 LAB — BASIC METABOLIC PANEL
ANION GAP: 8 (ref 5–15)
BUN: 67 mg/dL — ABNORMAL HIGH (ref 6–20)
CO2: 23 mmol/L (ref 22–32)
Calcium: 9 mg/dL (ref 8.9–10.3)
Chloride: 117 mmol/L — ABNORMAL HIGH (ref 101–111)
Creatinine, Ser: 2.06 mg/dL — ABNORMAL HIGH (ref 0.44–1.00)
GFR calc Af Amer: 24 mL/min — ABNORMAL LOW (ref >60)
GFR calc non Af Amer: 21 mL/min — ABNORMAL LOW (ref >60)
GLUCOSE: 135 mg/dL — AB (ref 65–99)
POTASSIUM: 3.7 mmol/L (ref 3.5–5.1)
Sodium: 148 mmol/L — ABNORMAL HIGH (ref 135–145)

## 2016-12-20 LAB — TROPONIN I: TROPONIN I: 53.89 ng/mL — AB (ref <0.03)

## 2016-12-20 LAB — GLUCOSE, CAPILLARY
GLUCOSE-CAPILLARY: 125 mg/dL — AB (ref 65–99)
GLUCOSE-CAPILLARY: 178 mg/dL — AB (ref 65–99)
Glucose-Capillary: 126 mg/dL — ABNORMAL HIGH (ref 65–99)
Glucose-Capillary: 159 mg/dL — ABNORMAL HIGH (ref 65–99)
Glucose-Capillary: 104 mg/dL — ABNORMAL HIGH (ref 65–99)
Glucose-Capillary: 129 mg/dL — ABNORMAL HIGH (ref 65–99)
Glucose-Capillary: 170 mg/dL — ABNORMAL HIGH (ref 65–99)

## 2016-12-20 LAB — PROCALCITONIN: Procalcitonin: 2.81 ng/mL

## 2016-12-20 LAB — VANCOMYCIN, RANDOM: Vancomycin Rm: 15

## 2016-12-20 LAB — ECHOCARDIOGRAM COMPLETE
HEIGHTINCHES: 63 in
Weight: 1742.52 oz

## 2016-12-20 LAB — HEPARIN LEVEL (UNFRACTIONATED)
Heparin Unfractionated: 0.27 IU/mL — ABNORMAL LOW (ref 0.30–0.70)
Heparin Unfractionated: 0.33 IU/mL (ref 0.30–0.70)

## 2016-12-20 MED ORDER — HEPARIN BOLUS VIA INFUSION
700.0000 [IU] | Freq: Once | INTRAVENOUS | Status: AC
  Administered 2016-12-20: 700 [IU] via INTRAVENOUS
  Filled 2016-12-20: qty 700

## 2016-12-20 MED ORDER — DILTIAZEM HCL 30 MG PO TABS
60.0000 mg | ORAL_TABLET | Freq: Four times a day (QID) | ORAL | Status: DC
  Administered 2016-12-21 – 2016-12-22 (×5): 60 mg via ORAL
  Filled 2016-12-20 (×5): qty 2

## 2016-12-20 MED ORDER — DILTIAZEM HCL 25 MG/5ML IV SOLN
5.0000 mg | Freq: Once | INTRAVENOUS | Status: AC
  Administered 2016-12-21: 5 mg via INTRAVENOUS
  Filled 2016-12-20: qty 5

## 2016-12-20 MED ORDER — DIGOXIN 0.25 MG/ML IJ SOLN
0.5000 mg | Freq: Once | INTRAMUSCULAR | Status: AC
Start: 2016-12-20 — End: 2016-12-20
  Administered 2016-12-20: 0.5 mg via INTRAVENOUS
  Filled 2016-12-20: qty 2

## 2016-12-20 MED ORDER — HEPARIN (PORCINE) IN NACL 100-0.45 UNIT/ML-% IJ SOLN
800.0000 [IU]/h | INTRAMUSCULAR | Status: DC
  Administered 2016-12-20: 700 [IU]/h via INTRAVENOUS
  Administered 2016-12-22: 800 [IU]/h via INTRAVENOUS
  Filled 2016-12-20 (×2): qty 250

## 2016-12-20 MED ORDER — SODIUM CHLORIDE 0.9 % IV BOLUS (SEPSIS)
250.0000 mL | Freq: Once | INTRAVENOUS | Status: AC
  Administered 2016-12-20: 250 mL via INTRAVENOUS

## 2016-12-20 MED ORDER — VANCOMYCIN HCL IN DEXTROSE 1-5 GM/200ML-% IV SOLN
1000.0000 mg | Freq: Once | INTRAVENOUS | Status: DC
  Administered 2016-12-20: 1000 mg via INTRAVENOUS
  Filled 2016-12-20: qty 200

## 2016-12-20 NOTE — Progress Notes (Signed)
ANTICOAGULATION CONSULT NOTE  Pharmacy Consult for Heparin Indication: chest pain/ACS  Allergies  Allergen Reactions  . Ace Inhibitors Swelling  . Galantamine Diarrhea  . Maxidex [Dexamethasone] Other (See Comments)    Reaction:  Unknown   . Maxzide [Hydrochlorothiazide W-Triamterene] Other (See Comments)    Reaction:  Hyponatremia   . Procardia [Nifedipine] Swelling  . Zoloft [Sertraline Hcl] Diarrhea    Patient Measurements: Height: 5\' 3"  (160 cm) Weight: 108 lb 14.5 oz (49.4 kg) IBW/kg (Calculated) : 52.4  Heparin weight 49.4 kg  Vital Signs: Temp: 98.3 F (36.8 C) (08/31 1653) Temp Source: Oral (08/31 1653) BP: 86/70 (08/31 1845) Pulse Rate: 132 (08/31 1830)  Labs:  Recent Labs  12/19/16 1145 12/19/16 1618 12/19/16 2205 12/20/16 0314 12/20/16 0602 12/20/16 1657  HGB 13.7  --   --  12.2  --   --   HCT 40.8  --   --  35.4  --   --   PLT 231  --   --  190  --   --   APTT  --  115*  --   --   --   --   LABPROT  --  18.2*  --   --   --   --   INR  --  1.52  --   --   --   --   HEPARINUNFRC  --   --  0.38  --  0.27* 0.33  CREATININE 2.28*  --   --  2.06*  --   --   TROPONINI 17.94* 22.24* 45.62* 53.89*  --   --     Estimated Creatinine Clearance: 15.3 mL/min (A) (by C-G formula based on SCr of 2.06 mg/dL (H)).   Medical History: Past Medical History:  Diagnosis Date  . Anxiety   . Chronic leg pain   . Dementia   . HTN (hypertension)   . Hypertension   . Hypothyroid   . Raynaud phenomenon     Assessment: 83 y/o F admitted with NSTEMI and no PTA anticoagulant use per RN.   Goal of Therapy:  Heparin level 0.3-0.7 units/ml Monitor platelets by anticoagulation protocol: Yes   Plan:  Give 3000 units bolus x 1 Start heparin infusion at 600 units/hr Check anti-Xa level in 8 hours and daily while on heparin Continue to monitor H&H and platelets   8/30:  HL @ 22:00 = 0.38 Will continue this pt on current rate and recheck HL in 8 hrs.   8/31: HL @  0600 = 0.27 Will order heparin 700 units IV X 1 bolus and increase drip rate to 100 units/hr. Will recheck HL 8 hrs after rate change.   8/31 PM: HL at 1657 = 0.33 Will continue this pt on current rate and recheck HL in 8 hrs.   Marty HeckWang, Cola High[place] L 12/20/2016,7:03 PM

## 2016-12-20 NOTE — Progress Notes (Signed)
Inpatient Diabetes Program Recommendations  AACE/ADA: New Consensus Statement on Inpatient Glycemic Control (2015)  Target Ranges:  Prepandial:   less than 140 mg/dL      Peak postprandial:   less than 180 mg/dL (1-2 hours)      Critically ill patients:  140 - 180 mg/dL   Lab Results  Component Value Date   GLUCAP 159 (H) 12/20/2016    Review of Glycemic Control  Results for Mercedes EdwardsBROOKS, Delinda J (MRN [id number]) as of 12/20/2016 10:28  Ref. Range 12/19/2016 16:36 12/19/2016 19:45 12/19/2016 23:33 12/20/2016 03:40 12/20/2016 08:06  Glucose-Capillary Latest Ref Range: 65 - 99 mg/dL 409319 (H) 811221 (H) 914179 (H) 126 (H) 159 (H)    Diabetes history: none noted Outpatient Diabetes medications: none Current orders for Inpatient glycemic control: Novolog 0-9 units q4h  Inpatient Diabetes Program Recommendations:   Agree with current medications for blood sugar management.    Please ensure blood sugar is checked within 1 hour of insulin being given and insulin is given 4 hours apart to decrease risk of hypoglycemia.   Susette RacerJulie Karuna Balducci, RN, BA, MHA, CDE Diabetes Coordinator Inpatient Diabetes Program  [phone] (Team Pager) [phone] [facility] Office) 12/20/2016 10:33 AM

## 2016-12-20 NOTE — Progress Notes (Signed)
[employer] [facility] [place] at [facility]   PATIENT NAME: Mercedes Powell    MR#:  [id number]  DATE OF BIRTH:  01/22/1929  SUBJECTIVE:   Patient seen earlier this morning. She is resting quietly  REVIEW OF SYSTEMS:    Unable to perform Critical illness  Tolerating Diet: npo      DRUG ALLERGIES:   Allergies  Allergen Reactions  . Ace Inhibitors Swelling  . Galantamine Diarrhea  . Maxidex [Dexamethasone] Other (See Comments)    Reaction:  Unknown   . Maxzide [Hydrochlorothiazide W-Triamterene] Other (See Comments)    Reaction:  Hyponatremia   . Procardia [Nifedipine] Swelling  . Zoloft [Sertraline Hcl] Diarrhea    VITALS:  Blood pressure (!) 169/92, pulse 86, temperature 98 F (36.7 C), temperature source Axillary, resp. rate (!) 24, height 5\' 3"  (1.6 m), weight 49.4 kg (108 lb 14.5 oz), SpO2 96 %.  PHYSICAL EXAMINATION:  Constitutional: AppearsIll appearing patient has dementia and nonverbal No distress. HENT: Normocephalic. Marland Kitchen Oropharynx is clear and moist.  Eyes: Conjunctivae and EOM are normal. PERRLA, no scleral icterus.  Neck: Normal ROM. Neck supple. No JVD. No tracheal deviation. CVS: RRR, S1/S2 +, no murmurs, no gallops, no carotid bruit.  Pulmonary: Effort and breath sounds normal, no stridor, rhonchi, wheezes, rales.  Abdominal: Soft. BS +,  no distension, tenderness, rebound or guarding.  Musculoskeletal: Normal range of motion. No edema and no tenderness.  Neuro: Unable to fully assess Patient not fully awake Skin: Skin is warm and dry. No rash noted. Psychiatric: Normal mood and affect.      LABORATORY PANEL:   CBC  Recent Labs Lab 12/20/16 0314  WBC 10.8  HGB 12.2  HCT 35.4  PLT 190   ------------------------------------------------------------------------------------------------------------------  Chemistries   Recent Labs Lab 12/19/16 1145 12/20/16 0314  NA 142 148*  K 4.1 3.7  CL 107 117*  CO2 20* 23  GLUCOSE 271*  135*  BUN 62* 67*  CREATININE 2.28* 2.06*  CALCIUM 9.8 9.0  AST 109*  --   ALT 37  --   ALKPHOS 72  --   BILITOT 1.4*  --    ------------------------------------------------------------------------------------------------------------------  Cardiac Enzymes  Recent Labs Lab 12/19/16 1618 12/19/16 2205 12/20/16 0314  TROPONINI 22.24* 45.62* 53.89*   ------------------------------------------------------------------------------------------------------------------  RADIOLOGY:  Dg Chest 1 View  Result Date: 12/20/2016 CLINICAL DATA:  Dyspnea. EXAM: CHEST 1 VIEW COMPARISON:  Chest radiograph December 19, 2016 FINDINGS: Cardiac silhouette is mildly enlarged and unchanged. Calcified aortic knob. Increasing RIGHT greater than LEFT perihilar airspace opacities. No pleural effusion. No pneumothorax. Osteopenia. IMPRESSION: Increasing RIGHT greater than LEFT perihilar airspace opacities concerning for pulmonary edema, less likely pneumonia. Mild cardiomegaly.  Chronic interstitial changes. Aortic Atherosclerosis (ICD10-I70.0). Electronically Signed   By: Awilda Metro M.D.   On: 12/20/2016 04:59   Ct Head Wo Contrast  Result Date: 12/19/2016 CLINICAL DATA:  Altered mental status for the past 3 days. No known injury. EXAM: CT HEAD WITHOUT CONTRAST TECHNIQUE: Contiguous axial images were obtained from the base of the skull through the vertex without intravenous contrast. COMPARISON:  12/17/2016. FINDINGS: Brain: Diffusely enlarged ventricles and subarachnoid spaces. Patchy white matter low density in both cerebral hemispheres. Stable bilateral basal ganglia and left thalamic lacunar infarcts. No intracranial hemorrhage, mass lesion or CT evidence of acute infarction. Vascular: No hyperdense vessel or unexpected calcification. Skull: Normal. Negative for fracture or focal lesion. Sinuses/Orbits: Small bilateral ethmoid sinus retention cysts. Status post bilateral cataract extraction. Other: None.  IMPRESSION:  1. No acute abnormality. 2. Stable marked diffuse cerebral atrophy and moderate cerebellar atrophy. 3. Stable extensive chronic small vessel white matter ischemic changes in both cerebral hemispheres and old lacunar infarcts, as described above. Electronically Signed   By: Beckie SaltsSteven  Reid M.D.   On: 12/19/2016 12:48   Dg Chest Portable 1 View  Result Date: 12/19/2016 CLINICAL DATA:  Altered mental status.  Tachypnea.  Ex-smoker. EXAM: PORTABLE CHEST 1 VIEW COMPARISON:  05/05/2015. FINDINGS: Interval mildly enlarged cardiac silhouette with increased prominence of the interstitial markings. Normal vasculature. No pleural fluid is seen. Unremarkable bones. IMPRESSION: Interval mild cardiomegaly and mild interstitial pulmonary edema. Electronically Signed   By: Beckie SaltsSteven  Reid M.D.   On: 12/19/2016 12:49     ASSESSMENT AND PLAN:   83 year old female with chronic dementia, hypothyroidism who was brought to the emergency room due to patient being less communicative and not ambulating.  1. Acute metabolic encephalopathy due to myocardial infarction and sepsis of unclear etiology  2. Acute MI: Troponin max 53.89 Patient evaluated by cardiology and due to underlying severe dementia and acute on chronic kidney disease recommendations are for conservative management Continue heparin, aspirin  3. Sepsis of unclear etiology: Chest x-ray and UA are negative Continue Zosyn  4. New onset atrial fibrillation with RVR: Continue IV diltiazem Follow up on echo  5. Acute on chronic kidney disease stage III from sepsis Continue IV fluids and avoid nephrotoxic agents  6. Progressive dementia: Palliative care consultation  Overall very poor prognosis   CODE STATUS: DNR  TOTAL TIME TAKING CARE OF THIS PATIENT: 30 minutes.     POSSIBLE D/C ??, DEPENDING ON CLINICAL CONDITION.   Aizlyn Schifano M.D on 12/20/2016 at 11:30 AM  Between 7am to 6pm - Pager - [phone] After 6pm go to [email or web address] -  password [email or web address]  [facility] [place] [facility]  Office  [phone]  CC: Primary care physician; Danella PentonMiller, Mark F, MD  Note: This dictation was prepared with Dragon dictation along with smaller phrase technology. Any transcriptional errors that result from this process are unintentional.

## 2016-12-20 NOTE — Progress Notes (Signed)
Pharmacy Antibiotic Note  Mercedes Powell is a 83 y.o. female admitted on 12/19/2016 with NSTEMI and sepsis.  Pharmacy has been consulted for vancomycin and Zosyn dosing.  Plan: After discussion with Dr. Belia HemanKasa, will discontinue vancomycin.   Zosyn 3.375 g EI q 12 hours 8 hours after initial dose.   Height: 5\' 3"  (160 cm) Weight: 108 lb 14.5 oz (49.4 kg) IBW/kg (Calculated) : 52.4  Temp (24hrs), Avg:97.7 F (36.5 C), Min:96.2 F (35.7 C), Max:98.5 F (36.9 C)   Recent Labs Lab 12/19/16 1145 12/19/16 1618 12/20/16 0314 12/20/16 0315  WBC 17.3*  --  10.8  --   CREATININE 2.28*  --  2.06*  --   LATICACIDVEN 3.2* 3.0*  --   --   VANCORANDOM  --   --   --  15    Estimated Creatinine Clearance: 15.3 mL/min (A) (by C-G formula based on SCr of 2.06 mg/dL (H)).    Allergies  Allergen Reactions  . Ace Inhibitors Swelling  . Galantamine Diarrhea  . Maxidex [Dexamethasone] Other (See Comments)    Reaction:  Unknown   . Maxzide [Hydrochlorothiazide W-Triamterene] Other (See Comments)    Reaction:  Hyponatremia   . Procardia [Nifedipine] Swelling  . Zoloft [Sertraline Hcl] Diarrhea    Antimicrobials this admission: Zosyn 8/30 >>  vancomycin 8/30 >>   Dose adjustments this admission:   Microbiology results: 8/30 BCx: sent  Thank you for allowing pharmacy to be a part of this patient's care.  Mercedes HartChristy, Mercedes Powell D 12/20/2016 11:26 AM

## 2016-12-20 NOTE — Progress Notes (Signed)
Pharmacy Antibiotic Note  Mercedes EdwardsBetty J Powell is a 83 y.o. female admitted on 12/19/2016 with NSTEMI and sepsis.  Pharmacy has been consulted for vancomycin and Zosyn dosing.  Plan: Would check a MRSA PCR depending on suspected source of infection. Would also check PCT.   Zosyn 3.375 g EI q 12 hours 8 hours after initial dose.   Vancomycin 1000 mg iv once to serve as a "loading dose." Due to ARF will check a random level with AM labs and dose by levels for now.   8/31:  Vanc level @ 0300 = 15 mcg/mL.   This reflects a post 12 hr dose as initial dose was on 8/30 @ 15:00.   Will order Vancomycin 1 gm IV X 1 on 8/31 @ 0600 and recheck vanc level on 8/31 @ 18:00.   Height: 5\' 3"  (160 cm) Weight: 108 lb 14.5 oz (49.4 kg) IBW/kg (Calculated) : 52.4  Temp (24hrs), Avg:97.7 F (36.5 C), Min:96.2 F (35.7 C), Max:98.5 F (36.9 C)   Recent Labs Lab 12/19/16 1145 12/19/16 1618 12/20/16 0314 12/20/16 0315  WBC 17.3*  --  10.8  --   CREATININE 2.28*  --  2.06*  --   LATICACIDVEN 3.2* 3.0*  --   --   VANCORANDOM  --   --   --  15    Estimated Creatinine Clearance: 15.3 mL/min (A) (by C-G formula based on SCr of 2.06 mg/dL (H)).    Allergies  Allergen Reactions  . Ace Inhibitors Swelling  . Galantamine Diarrhea  . Maxidex [Dexamethasone] Other (See Comments)    Reaction:  Unknown   . Maxzide [Hydrochlorothiazide W-Triamterene] Other (See Comments)    Reaction:  Hyponatremia   . Procardia [Nifedipine] Swelling  . Zoloft [Sertraline Hcl] Diarrhea    Antimicrobials this admission: Zosyn 8/30 >>  vancomycin 8/30 >>   Dose adjustments this admission:   Microbiology results: 8/30 BCx: sent  Thank you for allowing pharmacy to be a part of this patient's care.  Deisi Salonga D 12/20/2016 5:55 AM

## 2016-12-20 NOTE — Progress Notes (Signed)
Patient's HR still fluctuating  From 127 to upper 150's. BP came up to 150/87. Dr. Emmit PomfretHugelmeyer notified to see if there's a need to restart the Cardizem drip. New order to give 5  Mg IV Cardizem now and give the 60 mg oral  Cardizem scheduled at midnight   t an hour after. No acute distress noted. Will continue to monitor.

## 2016-12-20 NOTE — Progress Notes (Signed)
Report called to Tish, RN on 2A.  Patient will be moving to room 236.  Patient arouses to voice and alert to self.  Follows commands.  NSR per cardiac monitor.  Family at bedside.

## 2016-12-20 NOTE — Progress Notes (Signed)
RN spoke with Dr. Belia HemanKasa and made MD aware that patient had a run of SVT.  MD acknowledged and gave no new orders.

## 2016-12-20 NOTE — Consult Note (Signed)
Consultation Note Date: 12/20/2016   Patient Name: Mercedes Powell  DOB: 03/20/1929  MRN: [id number]  Age / Sex: 83 y.o., female  PCP: Mercedes Aus, MD Referring Physician: Bettey Costa, MD  Reason for Consultation: Establishing goals of care  HPI/Patient Profile: 83 y.o. female  with past medical history of Alzheimer's, hypertension, hypothyroidism, chronic leg pain, and anxiety admitted on 12/19/2016 with altered mental status. Per sister and daughter-in-law, patient has baseline dementia and has been declining for 4 weeks with increased weakness and poor appetite. Seen by PCP this week. CT negative for acute stroke. In ED, patient with WBC 17, lactic acid 3.2, BNP 3300, and troponin of 17. EKG revealed acute non-Q wave MI. Also with acute renal failure and afib RVR. Cardiology consulted in ED. Heparin and Cardizem infusions and antibiotics initiated. Troponin trending up. 53.89 today. Poor prognosis per attending. Palliative medicine consultation for goals of care.   Clinical Assessment and Goals of Care: I have reviewed medical records, discussed with care team, and met with daughter-in-law Scientific laboratory technician) and sister Mercedes Powell) in family waiting room to discuss diagnosis, prognosis, GOC, EOL wishes, disposition and options. Patient will open eyes to voice, pleasantly confused but attempting to talk this morning and will follow commands.   I introduced Palliative Medicine as specialized medical care for people living with serious illness. It focuses on providing relief from the symptoms and stress of a serious illness. The goal is to improve quality of life for both the patient and the family.  We discussed a brief life review of the patient. Widowed. Four adult children (all boys) and multiple grandchildren. Prior to hospitalization, living at an assisted living facility. Baseline Alzheimer's but has remained fairly  independent until this past month, when the patient started to decline with weakness, inability to perform ADL's, minimal speech, and with poor appetite.   Discussed hospital diagnoses, interventions, and underlying functional/cognitive/nutritional status decline. Mercedes Powell and Mercedes Powell have a good understanding of heart attack and guarded prognosis because she is not a candidate for aggressive cardiac procedures. Explained high risk for decompensation such a a cardiac arrest, another MI, or stroke. Mercedes Powell and Mercedes Powell confirm their understanding of this.   Advanced directives, concepts specific to code status, artifical feeding and hydration, and rehospitalization were considered and discussed. The patient does not have a documented living will or HCPOA but has spoken clearly to her family that she does NOT want resuscitation/life support or life prolonging measures. Mercedes Powell tells me "she never wanted to be a burden on the family" and "could not live like a vegetable." Mercedes Powell and Mercedes Powell speak of her being a "healthy" and "independent" person her entire life.   The difference between aggressive medical intervention and comfort care was considered in light of the patient's goals of care. Mercedes Powell tells me "the boys are not all on the same page." Two sons have a good understanding of guarded prognosis. Another son has asked about cardiac surgery. Mercedes Powell and Mercedes Powell understand this is NOT an option.   Mercedes Powell wishes to  continue medical management until all of the patient's sons have a good understanding. They are hopeful she will show improvement through the weekend but also very realistic that she may be nearing the EOL. We discussed barriers to her returning to previous baseline, including if she could pass a speech swallow evaluation or tolerate physical therapy. Briefly discussed hospice services as an option on discharge if appropriate.   Mercedes Powell tells me son's should all be visiting the hospital this weekend  and will have further discussions with them. If the patient should continue to decline, she confirms no heroic measures with resuscitation/life support. "We do not want her to suffer."   Therapeutic listening while family shared many stories of Mercedes Powell and her many talents including ballroom dancing, art, playing piano, and cooking. 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 Emotional and spiritual support provided.   **Shortly after conversation, spoke with one son at bedside. Updated on diagnoses, interventions, underlying co-morbidities, and guarded prognosis. Son states understanding.    SUMMARY OF RECOMMENDATIONS    DNR/DNI  Continue medical management. No escalation of care. Family I met with today are realistic of guarded prognosis.   Other sons should be coming in town this weekend for further Mercedes Powell discussions.   Briefly discussed hospice.   PMT not at [facility] over the weekend but will f/u next week to further support patient/family.  Code Status/Advance Care Planning:  DNR  Symptom Management:   Per attending  Palliative Prophylaxis:   Aspiration, Delirium Protocol, Frequent Pain Assessment, Oral Care and Turn Reposition  Additional Recommendations (Limitations, Scope, Preferences):  DNR/DNI. Continue medical management.   Psycho-social/Spiritual:   Desire for further Chaplaincy support: yes  Additional Recommendations: Caregiving  Support/Resources and Education on Hospice  Prognosis:   Unable to determine: guarded with NSTEMI, increasing troponins, sepsis secondary to aspiration pneumonia, acute renal failure, and baseline dementia with functional/cognitive/nutritional status decline.   Discharge Planning: To Be Determined      Primary Diagnoses: Present on Admission: . Acute non-Q wave non-ST elevation myocardial infarction  (NSTEMI) ([place])   I have reviewed the medical record, interviewed the patient and family, and examined the patient. The following aspects are pertinent.  Past Medical History:  Diagnosis Date  . Anxiety   . Chronic leg pain   . Dementia   . HTN (hypertension)   . Hypertension   . Hypothyroid   . Raynaud phenomenon    Social History   Social History  . Marital status: Widowed    Spouse name: N/A  . Number of children: N/A  . Years of education: N/A   Social History Main Topics  . Smoking status: Former Research scientist (life sciences)  . Smokeless tobacco: None  . Alcohol use No  . Drug use: No  . Sexual activity: Not Asked   Other Topics Concern  . None   Social History Narrative  . None   Family History  Problem Relation Age of Onset  . Hypertension Unknown    Scheduled Meds: . aspirin  300 mg Rectal Daily  . insulin aspart  0-9 Units Subcutaneous Q4H   Continuous Infusions: . diltiazem (CARDIZEM) infusion Stopped (12/19/16 2045)  . heparin 700 Units/hr (12/20/16 0836)  . piperacillin-tazobactam (ZOSYN)  IV Stopped (12/20/16 1403)   PRN Meds:.acetaminophen **OR** acetaminophen, albuterol, metoprolol tartrate, morphine injection, ondansetron **OR** ondansetron (ZOFRAN) IV, polyethylene  glycol Medications Prior to Admission:  Prior to Admission medications   Medication Sig Start Date End Date Taking? Authorizing Provider  acetaminophen (TYLENOL) 325 MG tablet Take 650 mg by mouth as needed for mild pain, moderate pain or fever.   Yes [provider]  aspirin EC 81 MG tablet Take 81 mg by mouth daily.   Yes [provider]  Calcium 500 MG CHEW Chew 500 mg by mouth daily.   Yes [provider]  Cholecalciferol (VITAMIN D) 2000 UNITS CAPS Take 2,000 Units by mouth daily.   Yes [provider]  hydrALAZINE (APRESOLINE) 50 MG tablet Take 50 mg by mouth 2 (two) times daily.   Yes [provider]  levothyroxine (SYNTHROID, LEVOTHROID) 75 MCG tablet  Take 75 mcg by mouth daily before breakfast.   Yes [provider]  Multiple Vitamin (MULTIVITAMIN WITH MINERALS) TABS tablet Take 1 tablet by mouth daily.   Yes [provider]  polyvinyl alcohol-povidone (HYPOTEARS) 1.4-0.6 % ophthalmic solution Place 1 drop into both eyes 4 (four) times daily.   Yes [provider]  amLODipine (NORVASC) 5 MG tablet Take 1 tablet (5 mg total) by mouth once. Patient not taking: Reported on 12/19/2016 02/27/15   Joanne Gavel, MD  oxyCODONE (OXY IR/ROXICODONE) 5 MG immediate release tablet Take 1 tablet (5 mg total) by mouth every 4 (four) hours as needed for severe pain. Patient not taking: Reported on 12/19/2016 07/20/15   Thornton Park, MD   Allergies  Allergen Reactions  . Ace Inhibitors Swelling  . Galantamine Diarrhea  . Maxidex [Dexamethasone] Other (See Comments)    Reaction:  Unknown   . Maxzide [Hydrochlorothiazide W-Triamterene] Other (See Comments)    Reaction:  Hyponatremia   . Procardia [Nifedipine] Swelling  . Zoloft [Sertraline Hcl] Diarrhea   Review of Systems  Unable to perform ROS: Acuity of condition   Physical Exam  Constitutional: She appears cachectic. She is easily aroused. She appears ill.  HENT:  Head: Normocephalic and atraumatic.  Cardiovascular: An irregularly irregular rhythm present.  Pulmonary/Chest: No accessory muscle usage. No tachypnea. No respiratory distress. She has decreased breath sounds.  Abdominal: Normal appearance.  Neurological: She is easily aroused.  Skin: Skin is warm and dry. There is pallor.  Psychiatric: Her speech is delayed. Cognition and memory are impaired. She is inattentive.  Nursing note and vitals reviewed.  Vital Signs: BP 134/76   Pulse 75   Temp 98 F (36.7 C) (Axillary)   Resp 16   Ht _0  (1.6 m)   Wt 49.4 kg (108 lb 14.5 oz)   SpO2 97%   BMI 19.29 kg/m  Pain Assessment: PAINAD   Pain Score: Asleep   SpO2: SpO2: 97 % O2 Device:SpO2: 97 % O2  Flow Rate: .O2 Flow Rate (L/min): 4 L/min  IO: Intake/output summary:   Intake/Output Summary (Last 24 hours) at 12/20/16 1450 Last data filed at 12/20/16 1400  Gross per 24 hour  Intake          2147.43 ml  Output                0 ml  Net          2147.43 ml    LBM:   Baseline Weight: Weight: 49.9 kg (110 lb) Most recent weight: Weight: 49.4 kg (108 lb 14.5 oz)     Palliative Assessment/Data: PPS 30%   Flowsheet Rows     Most Recent Value  Intake Tab  Referral  Department  Hospitalist  Unit at Time of Referral  ICU  Palliative Care Primary Diagnosis  Cardiac  Palliative Care Type  New Palliative care  Reason for referral  Clarify Goals of Care  Date first seen by Palliative Care  12/20/16  Clinical Assessment  Palliative Performance Scale Score  30%  Psychosocial & Spiritual Assessment  Palliative Care Outcomes  Patient/Family meeting held?  Yes  Who was at the meeting?  daughter-in-law, sister, son  Palliative Care Outcomes  Clarified goals of care, Provided end of life care assistance, Provided psychosocial or spiritual support, ACP counseling assistance, Counseled regarding hospice      Time In: 92 Time Out: 1000 Time Total: 69mn Greater than 50%  of this time was spent counseling and coordinating care related to the above assessment and plan.  Signed by:  MIhor Dow FNP-C Palliative Medicine Team  Phone: [phone]: [phone]  Please contact Palliative Medicine Team phone at [phone] questions and concerns.  For individual provider: See AShea Evans

## 2016-12-20 NOTE — Consult Note (Signed)
Reason for Consult: Elevated troponins non-STEMI Referring Physician: Emily Filbert primary Dr. Fritzi Mandes hospitalist  Mercedes Powell is an 83 y.o. female.  HPI: Patient's a 83 year old female with chronic severe dementia anxiety hypertension and hypothyroidism came to emergency room from home place with declining health over the last 4 weeks she's had a CT done from her primary physician because of concern strokes study was negative patient's had poor appetite not ambulating but denies any pain there is no evidence of fever. Family denies any previous cardiac history is hard to get a history from the patient because of severe dementia patient appeared to have altered mental status she was less alert today still concerned about possible infection of possible stroke laboratories suggested elevated white count elevated lactic acid elevated BNP and significantly elevated troponin of 17 Patient also had appeared to have acute onset renal insufficiency was no clear evidence of infection but she's been treated with broad-spectrum antibiotics and cardiology was consulted because of A. fib and elevated troponin  Past Medical History:  Diagnosis Date  . Anxiety   . Chronic leg pain   . Dementia   . HTN (hypertension)   . Hypertension   . Hypothyroid   . Raynaud phenomenon     Past Surgical History:  Procedure Laterality Date  . ABDOMINAL HYSTERECTOMY    . APPENDECTOMY    . breast lump removal    . HARDWARE REMOVAL Right 07/20/2015   Procedure: HARDWARE REMOVAL;  Surgeon: Thornton Park, MD;  Location: [facility] ORS;  Service: Orthopedics;  Laterality: Right;  . HIP ARTHROPLASTY Right 04/11/2015   Procedure: ARTHROPLASTY BIPOLAR HIP (HEMIARTHROPLASTY);  Surgeon: Thornton Park, MD;  Location: [facility] ORS;  Service: Orthopedics;  Laterality: Right;    Family History  Problem Relation Age of Onset  . Hypertension Unknown     Social History:  reports that she has quit smoking. She does not have any  smokeless tobacco history on file. She reports that she does not drink alcohol or use drugs.  Allergies:  Allergies  Allergen Reactions  . Ace Inhibitors Swelling  . Galantamine Diarrhea  . Maxidex [Dexamethasone] Other (See Comments)    Reaction:  Unknown   . Maxzide [Hydrochlorothiazide W-Triamterene] Other (See Comments)    Reaction:  Hyponatremia   . Procardia [Nifedipine] Swelling  . Zoloft [Sertraline Hcl] Diarrhea    Medications: I have reviewed the patient's current medications.  Results for orders placed or performed during the hospital encounter of 12/19/16 (from the past 48 hour(s))  Urinalysis, Complete w Microscopic     Status: Abnormal   Collection Time: 12/19/16 11:44 AM  Result Value Ref Range   Color, Urine AMBER (A) YELLOW    Comment: BIOCHEMICALS MAY BE AFFECTED BY COLOR   APPearance HAZY (A) CLEAR   Specific Gravity, Urine 1.017 1.005 - 1.030   pH 5.0 5.0 - 8.0   Glucose, UA 50 (A) NEGATIVE mg/dL   Hgb urine dipstick NEGATIVE NEGATIVE   Bilirubin Urine NEGATIVE NEGATIVE   Ketones, ur 5 (A) NEGATIVE mg/dL   Protein, ur 100 (A) NEGATIVE mg/dL   Nitrite NEGATIVE NEGATIVE   Leukocytes, UA NEGATIVE NEGATIVE   RBC / HPF 6-30 0 - 5 RBC/hpf   WBC, UA 0-5 0 - 5 WBC/hpf   Bacteria, UA NONE SEEN NONE SEEN   Squamous Epithelial / LPF NONE SEEN NONE SEEN  Brain natriuretic peptide     Status: Abnormal   Collection Time: 12/19/16 11:44 AM  Result Value Ref Range  B Natriuretic Peptide 3,288.0 (H) 0.0 - 100.0 pg/mL  Comprehensive metabolic panel     Status: Abnormal   Collection Time: 12/19/16 11:45 AM  Result Value Ref Range   Sodium 142 135 - 145 mmol/L   Potassium 4.1 3.5 - 5.1 mmol/L    Comment: HEMOLYSIS AT THIS LEVEL MAY AFFECT RESULT   Chloride 107 101 - 111 mmol/L   CO2 20 (L) 22 - 32 mmol/L   Glucose, Bld 271 (H) 65 - 99 mg/dL   BUN 62 (H) 6 - 20 mg/dL   Creatinine, Ser 2.28 (H) 0.44 - 1.00 mg/dL   Calcium 9.8 8.9 - 10.3 mg/dL   Total Protein 7.2  6.5 - 8.1 g/dL   Albumin 3.6 3.5 - 5.0 g/dL   AST 109 (H) 15 - 41 U/L   ALT 37 14 - 54 U/L   Alkaline Phosphatase 72 38 - 126 U/L   Total Bilirubin 1.4 (H) 0.3 - 1.2 mg/dL   GFR calc non Af Amer 18 (L) >60 mL/min   GFR calc Af Amer 21 (L) >60 mL/min    Comment: (NOTE) The eGFR has been calculated using the CKD EPI equation. This calculation has not been validated in all clinical situations. eGFR's persistently <60 mL/min signify possible Chronic Kidney Disease.    Anion gap 15 5 - 15  Troponin I     Status: Abnormal   Collection Time: 12/19/16 11:45 AM  Result Value Ref Range   Troponin I 17.94 (HH) <0.03 ng/mL    Comment: CRITICAL RESULT CALLED TO, READ BACK BY AND VERIFIED WITH  ALICIA GRANGER AT 1191 12/19/16 SDR   Lactic acid, plasma     Status: Abnormal   Collection Time: 12/19/16 11:45 AM  Result Value Ref Range   Lactic Acid, Venous 3.2 (HH) 0.5 - 1.9 mmol/L    Comment: CRITICAL RESULT CALLED TO, READ BACK BY AND VERIFIED WITH  ALICIA GRANGER AT 4782 12/19/16 SDR   CBC with Differential     Status: Abnormal   Collection Time: 12/19/16 11:45 AM  Result Value Ref Range   WBC 17.3 (H) 3.6 - 11.0 K/uL   RBC 4.30 3.80 - 5.20 MIL/uL   Hemoglobin 13.7 12.0 - 16.0 g/dL   HCT 40.8 35.0 - 47.0 %   MCV 94.8 80.0 - 100.0 fL   MCH 31.8 26.0 - 34.0 pg   MCHC 33.6 32.0 - 36.0 g/dL   RDW 13.8 11.5 - 14.5 %   Platelets 231 150 - 440 K/uL   Neutrophils Relative % 89 %   Neutro Abs 15.4 (H) 1.4 - 6.5 K/uL   Lymphocytes Relative 4 %   Lymphs Abs 0.8 (L) 1.0 - 3.6 K/uL   Monocytes Relative 7 %   Monocytes Absolute 1.2 (H) 0.2 - 0.9 K/uL   Eosinophils Relative 0 %   Eosinophils Absolute 0.0 0 - 0.7 K/uL   Basophils Relative 0 %   Basophils Absolute 0.0 0 - 0.1 K/uL  Culture, blood (routine x 2)     Status: None (Preliminary result)   Collection Time: 12/19/16  1:00 PM  Result Value Ref Range   Specimen Description BLOOD RIGHT AC    Special Requests      BOTTLES DRAWN AEROBIC  AND ANAEROBIC Blood Culture adequate volume   Culture NO GROWTH < 24 HOURS    Report Status PENDING   Culture, blood (routine x 2)     Status: None (Preliminary result)   Collection Time: 12/19/16  1:00  PM  Result Value Ref Range   Specimen Description BLOOD LEFT AC    Special Requests      BOTTLES DRAWN AEROBIC AND ANAEROBIC Blood Culture results may not be optimal due to an excessive volume of blood received in culture bottles   Culture NO GROWTH < 24 HOURS    Report Status PENDING   Glucose, capillary     Status: Abnormal   Collection Time: 12/19/16  3:24 PM  Result Value Ref Range   Glucose-Capillary 293 (H) 65 - 99 mg/dL  MRSA PCR Screening     Status: None   Collection Time: 12/19/16  3:26 PM  Result Value Ref Range   MRSA by PCR NEGATIVE NEGATIVE    Comment:        The GeneXpert MRSA Assay (FDA approved for NASAL specimens only), is one component of a comprehensive MRSA colonization surveillance program. It is not intended to diagnose MRSA infection nor to guide or monitor treatment for MRSA infections.   Lactic acid, plasma     Status: Abnormal   Collection Time: 12/19/16  4:18 PM  Result Value Ref Range   Lactic Acid, Venous 3.0 (HH) 0.5 - 1.9 mmol/L    Comment: CRITICAL RESULT CALLED TO, READ BACK BY AND VERIFIED WITH BRANDY MANSFIELD RN AT 6378 12/19/16. MSS   APTT     Status: Abnormal   Collection Time: 12/19/16  4:18 PM  Result Value Ref Range   aPTT 115 (H) 24 - 36 seconds    Comment:        IF BASELINE aPTT IS ELEVATED, SUGGEST PATIENT RISK ASSESSMENT BE USED TO DETERMINE APPROPRIATE ANTICOAGULANT THERAPY.   Protime-INR     Status: Abnormal   Collection Time: 12/19/16  4:18 PM  Result Value Ref Range   Prothrombin Time 18.2 (H) 11.4 - 15.2 seconds   INR 1.52   Troponin I     Status: Abnormal   Collection Time: 12/19/16  4:18 PM  Result Value Ref Range   Troponin I 22.24 (HH) <0.03 ng/mL    Comment: CRITICAL VALUE NOTED. VALUE IS CONSISTENT WITH  PREVIOUSLY REPORTED/CALLED VALUE.MSS  Procalcitonin - Baseline     Status: None   Collection Time: 12/19/16  4:18 PM  Result Value Ref Range   Procalcitonin 1.60 ng/mL    Comment:        Interpretation: PCT > 0.5 ng/mL and <= 2 ng/mL: Systemic infection (sepsis) is possible, but other conditions are known to elevate PCT as well. (NOTE)         ICU PCT Algorithm               Non ICU PCT Algorithm    ----------------------------     ------------------------------         PCT < 0.25 ng/mL                 PCT < 0.1 ng/mL     Stopping of antibiotics            Stopping of antibiotics       strongly encouraged.               strongly encouraged.    ----------------------------     ------------------------------       PCT level decrease by               PCT < 0.25 ng/mL       >= 80% from peak PCT       OR  PCT 0.25 - 0.5 ng/mL          Stopping of antibiotics                                             encouraged.     Stopping of antibiotics           encouraged.    ----------------------------     ------------------------------       PCT level decrease by              PCT >= 0.25 ng/mL       < 80% from peak PCT        AND PCT >= 0.5 ng/mL             Continuing antibiotics                                              encouraged.       Continuing antibiotics            encouraged.    ----------------------------     ------------------------------     PCT level increase compared          PCT > 0.5 ng/mL         with peak PCT AND          PCT >= 0.5 ng/mL             Escalation of antibiotics                                          strongly encouraged.      Escalation of antibiotics        strongly encouraged.   Glucose, capillary     Status: Abnormal   Collection Time: 12/19/16  4:36 PM  Result Value Ref Range   Glucose-Capillary 319 (H) 65 - 99 mg/dL  Glucose, capillary     Status: Abnormal   Collection Time: 12/19/16  7:45 PM  Result Value Ref Range   Glucose-Capillary 221  (H) 65 - 99 mg/dL  Heparin level (unfractionated)     Status: None   Collection Time: 12/19/16 10:05 PM  Result Value Ref Range   Heparin Unfractionated 0.38 0.30 - 0.70 IU/mL    Comment:        IF HEPARIN RESULTS ARE BELOW EXPECTED VALUES, AND PATIENT DOSAGE HAS BEEN CONFIRMED, SUGGEST FOLLOW UP TESTING OF ANTITHROMBIN III LEVELS.   Troponin I     Status: Abnormal   Collection Time: 12/19/16 10:05 PM  Result Value Ref Range   Troponin I 45.62 (HH) <0.03 ng/mL    Comment: CRITICAL RESULT CALLED TO, READ BACK BY AND VERIFIED WITH IMMA CULATE @ 2304 ON 12/19/2016 BY [facility]   Glucose, capillary     Status: Abnormal   Collection Time: 12/19/16 11:33 PM  Result Value Ref Range   Glucose-Capillary 179 (H) 65 - 99 mg/dL  Troponin I     Status: Abnormal   Collection Time: 12/20/16  3:14 AM  Result Value Ref Range   Troponin I 53.89 (HH) <0.03 ng/mL    Comment: CRITICAL VALUE NOTED. VALUE IS CONSISTENT WITH  PREVIOUSLY REPORTED/CALLED VALUE BY CA[facility]   Basic metabolic panel     Status: Abnormal   Collection Time: 12/20/16  3:14 AM  Result Value Ref Range   Sodium 148 (H) 135 - 145 mmol/L   Potassium 3.7 3.5 - 5.1 mmol/L   Chloride 117 (H) 101 - 111 mmol/L   CO2 23 22 - 32 mmol/L   Glucose, Bld 135 (H) 65 - 99 mg/dL   BUN 67 (H) 6 - 20 mg/dL   Creatinine, Ser 2.06 (H) 0.44 - 1.00 mg/dL   Calcium 9.0 8.9 - 10.3 mg/dL   GFR calc non Af Amer 21 (L) >60 mL/min   GFR calc Af Amer 24 (L) >60 mL/min    Comment: (NOTE) The eGFR has been calculated using the CKD EPI equation. This calculation has not been validated in all clinical situations. eGFR's persistently <60 mL/min signify possible Chronic Kidney Disease.    Anion gap 8 5 - 15  CBC     Status: Abnormal   Collection Time: 12/20/16  3:14 AM  Result Value Ref Range   WBC 10.8 3.6 - 11.0 K/uL   RBC 3.73 (L) 3.80 - 5.20 MIL/uL   Hemoglobin 12.2 12.0 - 16.0 g/dL   HCT 35.4 35.0 - 47.0 %   MCV 95.0 80.0 - 100.0 fL   MCH 32.8 26.0 -  34.0 pg   MCHC 34.6 32.0 - 36.0 g/dL   RDW 13.8 11.5 - 14.5 %   Platelets 190 150 - 440 K/uL  Procalcitonin     Status: None   Collection Time: 12/20/16  3:14 AM  Result Value Ref Range   Procalcitonin 2.81 ng/mL    Comment:        Interpretation: PCT > 2 ng/mL: Systemic infection (sepsis) is likely, unless other causes are known. (NOTE)         ICU PCT Algorithm               Non ICU PCT Algorithm    ----------------------------     ------------------------------         PCT < 0.25 ng/mL                 PCT < 0.1 ng/mL     Stopping of antibiotics            Stopping of antibiotics       strongly encouraged.               strongly encouraged.    ----------------------------     ------------------------------       PCT level decrease by               PCT < 0.25 ng/mL       >= 80% from peak PCT       OR PCT 0.25 - 0.5 ng/mL          Stopping of antibiotics                                             encouraged.     Stopping of antibiotics           encouraged.    ----------------------------     ------------------------------       PCT level decrease by              PCT >= 0.25 ng/mL       <  80% from peak PCT        AND PCT >= 0.5 ng/mL            Continuing antibiotics                                               encouraged.       Continuing antibiotics            encouraged.    ----------------------------     ------------------------------     PCT level increase compared          PCT > 0.5 ng/mL         with peak PCT AND          PCT >= 0.5 ng/mL             Escalation of antibiotics                                          strongly encouraged.      Escalation of antibiotics        strongly encouraged.   Vancomycin, random     Status: None   Collection Time: 12/20/16  3:15 AM  Result Value Ref Range   Vancomycin Rm 15     Comment:        Random Vancomycin therapeutic range is dependent on dosage and time of specimen collection. A peak range is 20.0-40.0 ug/mL A  trough range is 5.0-15.0 ug/mL          Glucose, capillary     Status: Abnormal   Collection Time: 12/20/16  3:40 AM  Result Value Ref Range   Glucose-Capillary 126 (H) 65 - 99 mg/dL  Heparin level (unfractionated)     Status: Abnormal   Collection Time: 12/20/16  6:02 AM  Result Value Ref Range   Heparin Unfractionated 0.27 (L) 0.30 - 0.70 IU/mL    Comment:        IF HEPARIN RESULTS ARE BELOW EXPECTED VALUES, AND PATIENT DOSAGE HAS BEEN CONFIRMED, SUGGEST FOLLOW UP TESTING OF ANTITHROMBIN III LEVELS.   Glucose, capillary     Status: Abnormal   Collection Time: 12/20/16  8:06 AM  Result Value Ref Range   Glucose-Capillary 159 (H) 65 - 99 mg/dL  Glucose, capillary     Status: Abnormal   Collection Time: 12/20/16 11:45 AM  Result Value Ref Range   Glucose-Capillary 125 (H) 65 - 99 mg/dL  Glucose, capillary     Status: Abnormal   Collection Time: 12/20/16  3:55 PM  Result Value Ref Range   Glucose-Capillary 104 (H) 65 - 99 mg/dL    Dg Chest 1 View  Result Date: 12/20/2016 CLINICAL DATA:  Dyspnea. EXAM: CHEST 1 VIEW COMPARISON:  Chest radiograph December 19, 2016 FINDINGS: Cardiac silhouette is mildly enlarged and unchanged. Calcified aortic knob. Increasing RIGHT greater than LEFT perihilar airspace opacities. No pleural effusion. No pneumothorax. Osteopenia. IMPRESSION: Increasing RIGHT greater than LEFT perihilar airspace opacities concerning for pulmonary edema, less likely pneumonia. Mild cardiomegaly.  Chronic interstitial changes. Aortic Atherosclerosis (ICD10-I70.0). Electronically Signed   By: Elon Alas M.D.   On: 12/20/2016 04:59   Ct Head Wo Contrast  Result Date: 12/19/2016 CLINICAL DATA:  Altered mental status for the past 3 days.  No known injury. EXAM: CT HEAD WITHOUT CONTRAST TECHNIQUE: Contiguous axial images were obtained from the base of the skull through the vertex without intravenous contrast. COMPARISON:  12/17/2016. FINDINGS: Brain: Diffusely enlarged  ventricles and subarachnoid spaces. Patchy white matter low density in both cerebral hemispheres. Stable bilateral basal ganglia and left thalamic lacunar infarcts. No intracranial hemorrhage, mass lesion or CT evidence of acute infarction. Vascular: No hyperdense vessel or unexpected calcification. Skull: Normal. Negative for fracture or focal lesion. Sinuses/Orbits: Small bilateral ethmoid sinus retention cysts. Status post bilateral cataract extraction. Other: None. IMPRESSION: 1. No acute abnormality. 2. Stable marked diffuse cerebral atrophy and moderate cerebellar atrophy. 3. Stable extensive chronic small vessel white matter ischemic changes in both cerebral hemispheres and old lacunar infarcts, as described above. Electronically Signed   By: Claudie Revering M.D.   On: 12/19/2016 12:48   Dg Chest Portable 1 View  Result Date: 12/19/2016 CLINICAL DATA:  Altered mental status.  Tachypnea.  Ex-smoker. EXAM: PORTABLE CHEST 1 VIEW COMPARISON:  05/05/2015. FINDINGS: Interval mildly enlarged cardiac silhouette with increased prominence of the interstitial markings. Normal vasculature. No pleural fluid is seen. Unremarkable bones. IMPRESSION: Interval mild cardiomegaly and mild interstitial pulmonary edema. Electronically Signed   By: Claudie Revering M.D.   On: 12/19/2016 12:49    Review of Systems  Unable to perform ROS: Dementia  Constitutional: Positive for malaise/fatigue.  Skin: Negative.   Neurological: Positive for weakness.   Blood pressure (!) 152/79, pulse 96, temperature 98 F (36.7 C), temperature source Axillary, resp. rate (!) 22, height 5' 3"  (1.6 m), weight 49.4 kg (108 lb 14.5 oz), SpO2 94 %. Physical Exam  Nursing note and vitals reviewed. Constitutional: She is oriented to person, place, and time. She appears well-developed and well-nourished.  HENT:  Head: Normocephalic and atraumatic.  Eyes: Pupils are equal, round, and reactive to light. Conjunctivae and EOM are normal.  Neck:  Normal range of motion. Neck supple.  Cardiovascular: Normal rate, regular rhythm and normal heart sounds.   Respiratory: Effort normal and breath sounds normal.  GI: Soft. Bowel sounds are normal.  Musculoskeletal: Normal range of motion.  Neurological: She is alert and oriented to person, place, and time. She has normal reflexes.  Skin: Skin is warm.  Psychiatric: Cognition and memory are impaired. She exhibits abnormal recent memory and abnormal remote memory.  dementia nonviable    Assessment/Plan: Non-STEMI Elevated troponin Atrial fibrillation Dementia Hypertension Possible sepsis Elevated white count . PLAN Agree with admission to CCU Agree with short-term anticoagulation Continue broad-spectrum antibiotic therapy until sepsis is fully evaluated Do not recommend long-term anticoagulation for A. Fib Do not recommend invasive strategy for non-STEMI Consider echocardiogram for elevated troponin non-STEMI Continue to treat the patient medically  Dwayne D Callwood 12/20/2016, 4:34 PM

## 2016-12-20 NOTE — Progress Notes (Signed)
ANTICOAGULATION CONSULT NOTE  Pharmacy Consult for Heparin Indication: chest pain/ACS  Allergies  Allergen Reactions  . Ace Inhibitors Swelling  . Galantamine Diarrhea  . Maxidex [Dexamethasone] Other (See Comments)    Reaction:  Unknown   . Maxzide [Hydrochlorothiazide W-Triamterene] Other (See Comments)    Reaction:  Hyponatremia   . Procardia [Nifedipine] Swelling  . Zoloft [Sertraline Hcl] Diarrhea    Patient Measurements: Height: 5\' 3"  (160 cm) Weight: 108 lb 14.5 oz (49.4 kg) IBW/kg (Calculated) : 52.4  Vital Signs: Temp: 97.7 F (36.5 C) (08/31 0400) Temp Source: Axillary (08/31 0400) BP: 132/80 (08/31 0700) Pulse Rate: 77 (08/31 0700)  Labs:  Recent Labs  12/19/16 1145 12/19/16 1618 12/19/16 2205 12/20/16 0314 12/20/16 0602  HGB 13.7  --   --  12.2  --   HCT 40.8  --   --  35.4  --   PLT 231  --   --  190  --   APTT  --  115*  --   --   --   LABPROT  --  18.2*  --   --   --   INR  --  1.52  --   --   --   HEPARINUNFRC  --   --  0.38  --  0.27*  CREATININE 2.28*  --   --  2.06*  --   TROPONINI 17.94* 22.24* 45.62* 53.89*  --     Estimated Creatinine Clearance: 15.3 mL/min (A) (by C-G formula based on SCr of 2.06 mg/dL (H)).   Medical History: Past Medical History:  Diagnosis Date  . Anxiety   . Chronic leg pain   . Dementia   . HTN (hypertension)   . Hypertension   . Hypothyroid   . Raynaud phenomenon     Assessment: 83 y/o F admitted with NSTEMI and no PTA anticoagulant use per RN.   Goal of Therapy:  Heparin level 0.3-0.7 units/ml Monitor platelets by anticoagulation protocol: Yes   Plan:  Give 3000 units bolus x 1 Start heparin infusion at 600 units/hr Check anti-Xa level in 8 hours and daily while on heparin Continue to monitor H&H and platelets   8/30:  HL @ 22:00 = 0.38 Will continue this pt on current rate and recheck HL in 8 hrs.   8/31: HL @ 0600 = 0.27 Will order heparin 700 units IV X 1 bolus and increase drip rate to  100 units/hr. Will recheck HL 8 hrs after rate change.   Kennie Snedden D 12/20/2016,7:32 AM

## 2016-12-20 NOTE — Evaluation (Signed)
Clinical/Bedside Swallow Evaluation Patient Details  Name: Mercedes EdwardsBetty J Powell MRN: [id number] Date of Birth: 1929-02-17  Today's Date: 12/20/2016 Time: SLP Start Time (ACUTE ONLY): 1800 SLP Stop Time (ACUTE ONLY): 1900 SLP Time Calculation (min) (ACUTE ONLY): 60 min  Past Medical History:  Past Medical History:  Diagnosis Date  . Anxiety   . Chronic leg pain   . Dementia   . HTN (hypertension)   . Hypertension   . Hypothyroid   . Raynaud phenomenon    Past Surgical History:  Past Surgical History:  Procedure Laterality Date  . ABDOMINAL HYSTERECTOMY    . APPENDECTOMY    . breast lump removal    . HARDWARE REMOVAL Right 07/20/2015   Procedure: HARDWARE REMOVAL;  Surgeon: Juanell FairlyKevin Krasinski, MD;  Location: [facility] ORS;  Service: Orthopedics;  Laterality: Right;  . HIP ARTHROPLASTY Right 04/11/2015   Procedure: ARTHROPLASTY BIPOLAR HIP (HEMIARTHROPLASTY);  Surgeon: Juanell FairlyKevin Krasinski, MD;  Location: [facility] ORS;  Service: Orthopedics;  Laterality: Right;   HPI:  Pt  is a 83 y.o. female with a known history of chronic dementia,anxiety, hypertension, hypothyroidism comes to the emergency room from home place a copy by family members. According to the family ( sister and daughter-in-law) patient has been overall declining for last 4 weeks. She has been less communicative and not ambulating and a poor appetite. She was seen 2 days ago at primary care physician's office and a CT head was done to rule out stroke. CT head was negative. Patient was admitted for evaluation and management of acute non-Q-wave MI, rapid A. Fib with RVR, sepsis, acute renal failure. Pt has been NPO since admission.    Assessment / Plan / Recommendation Clinical Impression  Pt appears at increased risk for aspiration secondary to declined Cognitive status (baseline Dementia) and overall acutely declined medical status (sepsis). Pt appears to exhibit adequate oropharyngeal phase swallow function for toleration of Puree and Honey  consistency liquids all via TSP following general aspiration precautions. Pt does require feeding support as well.  SLP Visit Diagnosis: Dysphagia, oropharyngeal phase (R13.12) (baseline Dementia)    Aspiration Risk  Mild aspiration risk;Moderate aspiration risk    Diet Recommendation  Dysphagia level 1(puree) w/ Honey consistency liquids; aspiration precautions  Medication Administration: Crushed with puree (as able)    Other  Recommendations Recommended Consults:  (Dietician f/u) Oral Care Recommendations: Oral care BID;Staff/trained caregiver to provide oral care (clean dentures) Other Recommendations: Order thickener from pharmacy;Prohibited food (jello, ice cream, thin soups);Remove water pitcher;Have oral suction available   Follow up Recommendations Skilled Nursing facility (TBD)      Frequency and Duration min 3x week  2 weeks       Prognosis Prognosis for Safe Diet Advancement: Fair Barriers to Reach Goals: Cognitive deficits;Severity of deficits      Swallow Study   General Date of Onset: 12/19/16 HPI: Pt  is a 83 y.o. female with a known history of chronic dementia,anxiety, hypertension, hypothyroidism comes to the emergency room from home place a copy by family members. According to the family ( sister and daughter-in-law) patient has been overall declining for last 4 weeks. She has been less communicative and not ambulating and a poor appetite. She was seen 2 days ago at primary care physician's office and a CT head was done to rule out stroke. CT head was negative. Patient was admitted for evaluation and management of acute non-Q-wave MI, rapid A. Fib with RVR, sepsis, acute renal failure. Pt has been NPO since admission.  Type of Study: Bedside Swallow Evaluation Previous Swallow Assessment: none indicated Diet Prior to this Study: NPO (currently) Temperature Spikes Noted: No (wbc 10.8) Respiratory Status: Nasal cannula (3 liters) History of Recent Intubation:  No Behavior/Cognition: Alert;Cooperative;Confused;Distractible;Requires cueing (baseline Dementia) Oral Cavity Assessment: Dried secretions;Dry (pasty) Oral Care Completed by SLP: Recent completion by staff (per report) Oral Cavity - Dentition: Dentures, top;Dentures, bottom Vision:  (n/a) Self-Feeding Abilities: Total assist Patient Positioning: Upright in bed Baseline Vocal Quality: Low vocal intensity (soft speech/mumbled) Volitional Cough: Cognitively unable to elicit Volitional Swallow: Unable to elicit    Oral/Motor/Sensory Function Overall Oral Motor/Sensory Function:  (appears to have decreased labial tone on Left)   Ice Chips Ice chips: Not tested   Thin Liquid Thin Liquid: Not tested    Nectar Thick Nectar Thick Liquid: Not tested   Honey Thick Honey Thick Liquid: Within functional limits Presentation: Spoon (fed; ~2 ozs) Other Comments: quite adequate   Puree Puree: Within functional limits Presentation: Spoon (fed; ~2 ozs) Other Comments: quite adequate   Solid   GO   Solid: Not tested         Jerilynn Som, MS, CCC-SLP Watson,Katherine 12/20/2016,7:05 PM

## 2016-12-20 NOTE — Progress Notes (Signed)
ANTICOAGULATION CONSULT NOTE  Pharmacy Consult for Heparin Indication: chest pain/ACS  Allergies  Allergen Reactions  . Ace Inhibitors Swelling  . Galantamine Diarrhea  . Maxidex [Dexamethasone] Other (See Comments)    Reaction:  Unknown   . Maxzide [Hydrochlorothiazide W-Triamterene] Other (See Comments)    Reaction:  Hyponatremia   . Procardia [Nifedipine] Swelling  . Zoloft [Sertraline Hcl] Diarrhea    Patient Measurements: Height: 5\' 3"  (160 cm) Weight: 108 lb 14.5 oz (49.4 kg) IBW/kg (Calculated) : 52.4  Vital Signs: Temp: 97.7 F (36.5 C) (08/31 0000) Temp Source: Oral (08/31 0000) BP: 130/82 (08/31 0300) Pulse Rate: 67 (08/31 0300)  Labs:  Recent Labs  12/19/16 1145 12/19/16 1618 12/19/16 2205  HGB 13.7  --   --   HCT 40.8  --   --   PLT 231  --   --   APTT  --  115*  --   LABPROT  --  18.2*  --   INR  --  1.52  --   HEPARINUNFRC  --   --  0.38  CREATININE 2.28*  --   --   TROPONINI 17.94* 22.24* 45.62*    Estimated Creatinine Clearance: 13.8 mL/min (A) (by C-G formula based on SCr of 2.28 mg/dL (H)).   Medical History: Past Medical History:  Diagnosis Date  . Anxiety   . Chronic leg pain   . Dementia   . HTN (hypertension)   . Hypertension   . Hypothyroid   . Raynaud phenomenon     Assessment: 83 y/o F admitted with NSTEMI and no PTA anticoagulant use per RN.   Goal of Therapy:  Heparin level 0.3-0.7 units/ml Monitor platelets by anticoagulation protocol: Yes   Plan:  Give 3000 units bolus x 1 Start heparin infusion at 600 units/hr Check anti-Xa level in 8 hours and daily while on heparin Continue to monitor H&H and platelets   8/30:  HL @ 22:00 = 0.38 Will continue this pt on current rate and recheck HL in 8 hrs.   Siriah Treat D 12/20/2016,3:04 AM

## 2016-12-20 NOTE — Progress Notes (Addendum)
Spoke with Dr. Gwyneth Revelsallwoood regarding blood pressure of 86/70 after patient being on diltiazem for 10 minutes. Dilitazem stopped. Orders to give 250 NS bolus, 0.5mg  digoxin IV once.

## 2016-12-20 NOTE — Progress Notes (Signed)
*  PRELIMINARY RESULTS* Echocardiogram 2D Echocardiogram has been performed.  Mercedes Powell, Mercedes Powell 12/20/2016, 11:20 AM

## 2016-12-20 NOTE — Progress Notes (Signed)
1655 Received patient from CCU. Patient found to be in Afib with RVR, HR in the 160, RR 33. Dr. Juliene PinaMody paged regarding HR. MD to contact cardiology.  [phone] Spoke with Dr. Juliann Paresallwood. Orders given Patient to receive 5mg  lopressor IV  x2, with heart rate goal in the 100's.

## 2016-12-20 NOTE — Progress Notes (Signed)
Patient moved to room 236 by bed with Byrd HesselbachMaria, NT.  Son at side.  Patient on o2 and tele for transfer.

## 2016-12-21 LAB — BASIC METABOLIC PANEL
Anion gap: 8 (ref 5–15)
BUN: 69 mg/dL — AB (ref 6–20)
CHLORIDE: 120 mmol/L — AB (ref 101–111)
CO2: 20 mmol/L — AB (ref 22–32)
CREATININE: 2.02 mg/dL — AB (ref 0.44–1.00)
Calcium: 9.5 mg/dL (ref 8.9–10.3)
GFR calc non Af Amer: 21 mL/min — ABNORMAL LOW (ref >60)
GFR, EST AFRICAN AMERICAN: 25 mL/min — AB (ref >60)
GLUCOSE: 149 mg/dL — AB (ref 65–99)
Potassium: 3.9 mmol/L (ref 3.5–5.1)
Sodium: 148 mmol/L — ABNORMAL HIGH (ref 135–145)

## 2016-12-21 LAB — CBC
HCT: 36.8 % (ref 35.0–47.0)
Hemoglobin: 12.3 g/dL (ref 12.0–16.0)
MCH: 32.3 pg (ref 26.0–34.0)
MCHC: 33.5 g/dL (ref 32.0–36.0)
MCV: 96.4 fL (ref 80.0–100.0)
PLATELETS: 217 10*3/uL (ref 150–440)
RBC: 3.82 MIL/uL (ref 3.80–5.20)
RDW: 14 % (ref 11.5–14.5)
WBC: 12.2 10*3/uL — AB (ref 3.6–11.0)

## 2016-12-21 LAB — GLUCOSE, CAPILLARY
GLUCOSE-CAPILLARY: 173 mg/dL — AB (ref 65–99)
Glucose-Capillary: 167 mg/dL — ABNORMAL HIGH (ref 65–99)
Glucose-Capillary: 248 mg/dL — ABNORMAL HIGH (ref 65–99)
Glucose-Capillary: 212 mg/dL — ABNORMAL HIGH (ref 65–99)
Glucose-Capillary: 148 mg/dL — ABNORMAL HIGH (ref 65–99)

## 2016-12-21 LAB — HEPARIN LEVEL (UNFRACTIONATED)
HEPARIN UNFRACTIONATED: 0.46 [IU]/mL (ref 0.30–0.70)
Heparin Unfractionated: 0.29 IU/mL — ABNORMAL LOW (ref 0.30–0.70)
Heparin Unfractionated: 0.36 IU/mL (ref 0.30–0.70)

## 2016-12-21 LAB — PROCALCITONIN: PROCALCITONIN: 1.71 ng/mL

## 2016-12-21 MED ORDER — DIGOXIN 0.25 MG/ML IJ SOLN
0.2500 mg | Freq: Once | INTRAMUSCULAR | Status: DC
  Filled 2016-12-21: qty 1

## 2016-12-21 MED ORDER — DIGOXIN 0.25 MG/ML IJ SOLN
0.2500 mg | Freq: Once | INTRAMUSCULAR | Status: AC
  Administered 2016-12-21: 0.25 mg via INTRAVENOUS
  Filled 2016-12-21: qty 1

## 2016-12-21 MED ORDER — DIGOXIN 125 MCG PO TABS
0.1250 mg | ORAL_TABLET | Freq: Every day | ORAL | Status: DC
  Filled 2016-12-21: qty 1

## 2016-12-21 MED ORDER — HEPARIN BOLUS VIA INFUSION
700.0000 [IU] | Freq: Once | INTRAVENOUS | Status: AC
  Administered 2016-12-21: 700 [IU] via INTRAVENOUS
  Filled 2016-12-21: qty 700

## 2016-12-21 NOTE — Progress Notes (Signed)
[facility] - [place] at [facility]   PATIENT NAME: Mercedes Powell    MR#:  [id number]  DATE OF BIRTH:  03-10-1929  SUBJECTIVE:   Patient had asymptomatic sinus pause less then 3 seconds last night.  REVIEW OF SYSTEMS:    Unable to perform dementia  Tolerating Diet: yes     DRUG ALLERGIES:   Allergies  Allergen Reactions  . Ace Inhibitors Swelling  . Galantamine Diarrhea  . Maxidex [Dexamethasone] Other (See Comments)    Reaction:  Unknown   . Maxzide [Hydrochlorothiazide W-Triamterene] Other (See Comments)    Reaction:  Hyponatremia   . Procardia [Nifedipine] Swelling  . Zoloft [Sertraline Hcl] Diarrhea    VITALS:  Blood pressure (!) 161/87, pulse 68, temperature 97.9 F (36.6 C), temperature source Oral, resp. rate 18, height 5\' 3"  (1.6 m), weight 49.4 kg (108 lb 14.5 oz), SpO2 90 %.  PHYSICAL EXAMINATION:  Constitutional: Appears Ill appearing patient has dementia  No distress. HENT: Normocephalic. Marland Kitchen Oropharynx is clear and moist.  Eyes: Conjunctivae and EOM are normal. PERRLA, no scleral icterus.  Neck: Normal ROM. Neck supple. No JVD. No tracheal deviation. CVS: RRR, S1/S2 +, no murmurs, no gallops, no carotid bruit.  Pulmonary: Effort and breath sounds normal, no stridor, rhonchi, wheezes, rales.  Abdominal: Soft. BS +,  no distension, tenderness, rebound or guarding.  Musculoskeletal: Normal range of motion. No edema and no tenderness.  Neuro: Follows all commands  Skin: Skin is warm and dry. No rash noted. Psychiatric: Normal mood and affect.      LABORATORY PANEL:   CBC  Recent Labs Lab 12/21/16 0325  WBC 12.2*  HGB 12.3  HCT 36.8  PLT 217   ------------------------------------------------------------------------------------------------------------------  Chemistries   Recent Labs Lab 12/19/16 1145 12/20/16 0314  NA 142 148*  K 4.1 3.7  CL 107 117*  CO2 20* 23  GLUCOSE 271* 135*  BUN 62* 67*  CREATININE 2.28*  2.06*  CALCIUM 9.8 9.0  AST 109*  --   ALT 37  --   ALKPHOS 72  --   BILITOT 1.4*  --    ------------------------------------------------------------------------------------------------------------------  Cardiac Enzymes  Recent Labs Lab 12/19/16 1618 12/19/16 2205 12/20/16 0314  TROPONINI 22.24* 45.62* 53.89*   ------------------------------------------------------------------------------------------------------------------  RADIOLOGY:  Dg Chest 1 View  Result Date: 12/20/2016 CLINICAL DATA:  Dyspnea. EXAM: CHEST 1 VIEW COMPARISON:  Chest radiograph December 19, 2016 FINDINGS: Cardiac silhouette is mildly enlarged and unchanged. Calcified aortic knob. Increasing RIGHT greater than LEFT perihilar airspace opacities. No pleural effusion. No pneumothorax. Osteopenia. IMPRESSION: Increasing RIGHT greater than LEFT perihilar airspace opacities concerning for pulmonary edema, less likely pneumonia. Mild cardiomegaly.  Chronic interstitial changes. Aortic Atherosclerosis (ICD10-I70.0). Electronically Signed   By: Awilda Metro M.D.   On: 12/20/2016 04:59   Ct Head Wo Contrast  Result Date: 12/19/2016 CLINICAL DATA:  Altered mental status for the past 3 days. No known injury. EXAM: CT HEAD WITHOUT CONTRAST TECHNIQUE: Contiguous axial images were obtained from the base of the skull through the vertex without intravenous contrast. COMPARISON:  12/17/2016. FINDINGS: Brain: Diffusely enlarged ventricles and subarachnoid spaces. Patchy white matter low density in both cerebral hemispheres. Stable bilateral basal ganglia and left thalamic lacunar infarcts. No intracranial hemorrhage, mass lesion or CT evidence of acute infarction. Vascular: No hyperdense vessel or unexpected calcification. Skull: Normal. Negative for fracture or focal lesion. Sinuses/Orbits: Small bilateral ethmoid sinus retention cysts. Status post bilateral cataract extraction. Other: None. IMPRESSION: 1. No acute abnormality. 2.  Stable marked diffuse cerebral atrophy and moderate cerebellar atrophy. 3. Stable extensive chronic small vessel white matter ischemic changes in both cerebral hemispheres and old lacunar infarcts, as described above. Electronically Signed   By: Beckie SaltsSteven  Reid M.D.   On: 12/19/2016 12:48   Dg Chest Portable 1 View  Result Date: 12/19/2016 CLINICAL DATA:  Altered mental status.  Tachypnea.  Ex-smoker. EXAM: PORTABLE CHEST 1 VIEW COMPARISON:  05/05/2015. FINDINGS: Interval mildly enlarged cardiac silhouette with increased prominence of the interstitial markings. Normal vasculature. No pleural fluid is seen. Unremarkable bones. IMPRESSION: Interval mild cardiomegaly and mild interstitial pulmonary edema. Electronically Signed   By: Beckie SaltsSteven  Reid M.D.   On: 12/19/2016 12:49     ASSESSMENT AND PLAN:   83 year old female with chronic dementia, hypothyroidism who was brought to the emergency room due to patient being less communicative and not ambulating.  1. Acute metabolic encephalopathy due to myocardial infarction and sepsis of unclear etiology  2. Acute MI: Troponin max 53.89 Patient evaluated by cardiology and due to underlying severe dementia and acute on chronic kidney disease recommendations are for conservative management Continue heparin, aspirin  3. Sepsis of unclear etiology: Chest x-ray and UA are negative Continue Zosyn For now in a final blood cultures are negative then may discontinue  4. New onset atrial fibrillation with RVR: Continue diltiazem Echocardiogram shows ejection fraction 45-50% without valvular abnormalities no wall motion abnormalities Follow up on cardiology with medications  5. Acute on chronic kidney disease stage III from sepsis Continue IV fluids and avoid nephrotoxic agents BMP for a.m.  6. Progressive dementia: Palliative care consultation  Overall very poor prognosis   CODE STATUS: DNR  TOTAL TIME TAKING CARE OF THIS PATIENT: 24 minutes.   Physical  therapy evaluation in a.m. D/w patient's son.    POSSIBLE D/C ??, DEPENDING ON CLINICAL CONDITION.   Narda Fundora M.D on 12/21/2016 at 8:13 AM  Between 7am to 6pm - Pager - [phone] After 6pm go to [email or web address] - password [email or web address]  [facility]  Office  [phone]  CC: Primary care physician; Danella PentonMiller, Mark F, MD  Note: This dictation was prepared with Dragon dictation along with smaller phrase technology. Any transcriptional errors that result from this process are unintentional.

## 2016-12-21 NOTE — Progress Notes (Signed)
ANTICOAGULATION CONSULT NOTE  Pharmacy Consult for Heparin Indication: chest pain/ACS  Allergies  Allergen Reactions  . Ace Inhibitors Swelling  . Galantamine Diarrhea  . Maxidex [Dexamethasone] Other (See Comments)    Reaction:  Unknown   . Maxzide [Hydrochlorothiazide W-Triamterene] Other (See Comments)    Reaction:  Hyponatremia   . Procardia [Nifedipine] Swelling  . Zoloft [Sertraline Hcl] Diarrhea    Patient Measurements: Height: 5\' 3"  (160 cm) Weight: 108 lb 14.5 oz (49.4 kg) IBW/kg (Calculated) : 52.4  Heparin weight 49.4 kg  Vital Signs: Temp: 97.3 F (36.3 C) (09/01 1205) Temp Source: Oral (09/01 1205) BP: 153/65 (09/01 1205) Pulse Rate: 116 (09/01 1205)  Labs:  Recent Labs  12/19/16 1145 12/19/16 1618 12/19/16 2205 12/20/16 0314  12/20/16 1657 12/21/16 0325 12/21/16 1326  HGB 13.7  --   --  12.2  --   --  12.3  --   HCT 40.8  --   --  35.4  --   --  36.8  --   PLT 231  --   --  190  --   --  217  --   APTT  --  115*  --   --   --   --   --   --   LABPROT  --  18.2*  --   --   --   --   --   --   INR  --  1.52  --   --   --   --   --   --   HEPARINUNFRC  --   --  0.38  --   < > 0.33 0.29* 0.36  CREATININE 2.28*  --   --  2.06*  --   --  2.02*  --   TROPONINI 17.94* 22.24* 45.62* 53.89*  --   --   --   --   < > = values in this interval not displayed.  Estimated Creatinine Clearance: 15.6 mL/min (A) (by C-G formula based on SCr of 2.02 mg/dL (H)).   Medical History: Past Medical History:  Diagnosis Date  . Anxiety   . Chronic leg pain   . Dementia   . HTN (hypertension)   . Hypertension   . Hypothyroid   . Raynaud phenomenon     Assessment: 83 y/o F admitted with NSTEMI and no PTA anticoagulant use per RN.   Goal of Therapy:  Heparin level 0.3-0.7 units/ml Monitor platelets by anticoagulation protocol: Yes   Plan:  Give 3000 units bolus x 1 Start heparin infusion at 600 units/hr Check anti-Xa level in 8 hours and daily while on  heparin Continue to monitor H&H and platelets   8/30:  HL @ 22:00 = 0.38 Will continue this pt on current rate and recheck HL in 8 hrs.   8/31: HL @ 0600 = 0.27 Will order heparin 700 units IV X 1 bolus and increase drip rate to 100 units/hr. Will recheck HL 8 hrs after rate change.   8/31 PM: HL at 1657 = 0.33 Will continue this pt on current rate and recheck HL in 8 hrs.   9/1 0300: Heparin level resulted at 0.29, will order Heparin 700 units IV x 1 bolus and increase drip to 800 units/hr. Next Heparin level in 8 hours.  9/1 1325: Heparin level = 0.36, within goal range. RN confirms heparin drip running at 8 ml/hr; no s/sx of bleeding noted.  Will continue current heparin drip rate. Recheck heparin level in  8 hours. Hgb/Plt stable. CBC in AM.    Crist Fat, PharmD, BCPS Clinical Pharmacist 12/21/2016 2:40 PM

## 2016-12-21 NOTE — Progress Notes (Signed)
ANTICOAGULATION CONSULT NOTE  Pharmacy Consult for Heparin Indication: chest pain/ACS  Allergies  Allergen Reactions  . Ace Inhibitors Swelling  . Galantamine Diarrhea  . Maxidex [Dexamethasone] Other (See Comments)    Reaction:  Unknown   . Maxzide [Hydrochlorothiazide W-Triamterene] Other (See Comments)    Reaction:  Hyponatremia   . Procardia [Nifedipine] Swelling  . Zoloft [Sertraline Hcl] Diarrhea    Patient Measurements: Height: 5\' 3"  (160 cm) Weight: 108 lb 14.5 oz (49.4 kg) IBW/kg (Calculated) : 52.4  Heparin weight 49.4 kg  Vital Signs: Temp: 97.5 F (36.4 C) (08/31 2257) Temp Source: Oral (08/31 2257) BP: 150/87 (08/31 2257) Pulse Rate: 139 (08/31 2257)  Labs:  Recent Labs  12/19/16 1145 12/19/16 1618  12/19/16 2205 12/20/16 0314 12/20/16 0602 12/20/16 1657 12/21/16 0325  HGB 13.7  --   --   --  12.2  --   --  12.3  HCT 40.8  --   --   --  35.4  --   --  36.8  PLT 231  --   --   --  190  --   --  217  APTT  --  115*  --   --   --   --   --   --   LABPROT  --  18.2*  --   --   --   --   --   --   INR  --  1.52  --   --   --   --   --   --   HEPARINUNFRC  --   --   < > 0.38  --  0.27* 0.33 0.29*  CREATININE 2.28*  --   --   --  2.06*  --   --   --   TROPONINI 17.94* 22.24*  --  45.62* 53.89*  --   --   --   < > = values in this interval not displayed.  Estimated Creatinine Clearance: 15.3 mL/min (A) (by C-G formula based on SCr of 2.06 mg/dL (H)).   Medical History: Past Medical History:  Diagnosis Date  . Anxiety   . Chronic leg pain   . Dementia   . HTN (hypertension)   . Hypertension   . Hypothyroid   . Raynaud phenomenon     Assessment: 83 y/o F admitted with NSTEMI and no PTA anticoagulant use per RN.   Goal of Therapy:  Heparin level 0.3-0.7 units/ml Monitor platelets by anticoagulation protocol: Yes   Plan:  Give 3000 units bolus x 1 Start heparin infusion at 600 units/hr Check anti-Xa level in 8 hours and daily while on  heparin Continue to monitor H&H and platelets   8/30:  HL @ 22:00 = 0.38 Will continue this pt on current rate and recheck HL in 8 hrs.   8/31: HL @ 0600 = 0.27 Will order heparin 700 units IV X 1 bolus and increase drip rate to 100 units/hr. Will recheck HL 8 hrs after rate change.   8/31 PM: HL at 1657 = 0.33 Will continue this pt on current rate and recheck HL in 8 hrs.   9/1 0300: Heparin level resulted at 0.29, will order Heparin 700 units IV x 1 bolus and increase drip to 800 units/hr. Next Heparin level in 8 hours.  Clovia CuffLisa Andersen Mckiver, PharmD, BCPS 12/21/2016 4:02 AM

## 2016-12-21 NOTE — Progress Notes (Signed)
Patient's had 1.73 and another 2.41 pulses this shift. Patient is asymptomatic. Dr. Tobi BastosPyreddy notified and he stated to D/C digoxin and beta blockers but none seen on MAR. HR controlled this morning, mostly stays in the 80s.  Will continue to monitor.

## 2016-12-21 NOTE — Clinical Social Work Note (Signed)
Clinical Social Work Assessment  Patient Details  Name: Mercedes EdwardsBetty J Powell MRN: [id number] Date of Birth: Sep 13, 1928  Date of referral:  12/21/16               Reason for consult:  Other (Comment Required), Facility Placement (From [facility] )                Permission sought to share information with:  Oceanographeracility Contact Representative Permission granted to share information::  Yes, Verbal Permission Granted  Name::      Skilled Nursing Facility VS [facility]   Agency::     Relationship::     Contact Information:     Housing/Transportation Living arrangements for the past 2 months:  Assisted Living Facility Source of Information:  Adult Children Patient Interpreter Needed:  None Criminal Activity/Legal Involvement Pertinent to Current Situation/Hospitalization:  No - Comment as needed Significant Relationships:  Adult Children Lives with:  Facility Resident Do you feel safe going back to the place where you live?    Need for family participation in patient care:  Yes (Comment)  Care giving concerns:  [facility] a [facility] (fax: [phone]).    Social Worker assessment / plan:  Visual merchandiserClinical Social Worker (CSW) reviewed chart and noted that patient is from [facility]. PT is pending. [facility] get [facility]. Per [facility] patient is on the regular ALF side and has been a resident for several years. Per [facility] patient has declined in the past 2 weeks and has not been ambulating. Per [facility] prior to 2 weeks ago patient has walking with a walker at baseline. Per [facility]al [facility] they will accept patient back if she is at her baseline. CSW attempted to meet with patient however she could not answer questions. CSW contacted patient's son Mercedes Powell. Per Mercedes Powell he is the oldest of 4 boys and is patient's POA. Mercedes Powell is agreeable for patient to return to [facility] or SNF for short term rehab if needed. [facility] [place]  complete. CSW will continue to follow and assist as needed.   Employment status:  Disabled (Comment on whether or not currently receiving Disability) Insurance information:  Medicare PT Recommendations:  Not assessed at this time Information / Referral to community resources:  Skilled Nursing Facility  Patient/Family's Response to care:  Patient's son is agreeable to SNF if needed.   Patient/Family's Understanding of and Emotional Response to Diagnosis, Current Treatment, and Prognosis:  Patient's son was very pleasant and thanked CSW for calling.   Emotional Assessment Appearance:  Appears stated age Attitude/Demeanor/Rapport:  Unable to Assess Affect (typically observed):  Unable to Assess Orientation:  Oriented to Self, Fluctuating Orientation (Suspected and/or reported Sundowners) Alcohol / Substance use:  Not Applicable Psych involvement (Current and /or in the community):  No (Comment)  Discharge Needs  Concerns to be addressed:  Discharge Planning Concerns Readmission within the last 30 days:  No Current discharge risk:  Dependent with Mobility, Chronically ill, Cognitively Impaired Barriers to Discharge:  Continued Medical Work up   [employer]Sample, Darleen CrockerBailey M, LCSW 12/21/2016, 4:34 PM

## 2016-12-21 NOTE — Progress Notes (Signed)
PT Cancellation Note  Patient Details Name: Yevette EdwardsBetty J Manahan MRN: [id number] DOB: 03-22-1929   Cancelled Treatment:    Reason Eval/Treat Not Completed: Medical issues which prohibited therapy (Consult received and chart reviewed.  Per chart review, order placed this date by Dr. Juliene PinaMody indicates start tomorrow (9/2).  Of note, patient with NSTEMI, max troponin 53.89; recommended for conservative management.  Per order, will allow one additional day for rest prior to initiation of exertional activity.  Will re-attempt next date as medically appropriate.)   Nikita Surman H. Manson PasseyBrown, PT, DPT, NCS 12/21/16, 11:33 AM [phone]

## 2016-12-21 NOTE — [place] FL2 (Signed)
[place] MEDICAID FL2 LEVEL OF CARE SCREENING TOOL     IDENTIFICATION  Patient Name: Mercedes EdwardsBetty J Wildermuth Birthdate: 1928/06/26 Sex: female Admission Date (Current Location): 12/19/2016  [place]   Facility and Address:  [facility], [address], [place], [place] [address]      Provider Number: [id number]  Attending Physician Name and Address:  Adrian SaranMody, Sital, MD  Relative Name and Phone Number:       Current Level of Care: Hospital Recommended Level of Care: Skilled Nursing Facility Prior Approval Number:    Date Approved/Denied:   PASRR Number:  [id number] )  Discharge Plan: SNF    Current Diagnoses: Patient Active Problem List   Diagnosis Date Noted  . Elevated troponin   . Sepsis (HCC)   . Acute kidney injury (HCC)   . Alzheimer's dementia without behavioral disturbance   . Palliative care by specialist   . Goals of care, counseling/discussion   . Acute non-Q wave non-ST elevation myocardial infarction (NSTEMI) (HCC) 12/19/2016  . Hip fracture (HCC) 04/10/2015    Orientation RESPIRATION BLADDER Height & Weight     Self  O2 (3 Liters Oxygen ) Incontinent Weight: 108 lb 14.5 oz (49.4 kg) Height:  5\' 3"  (160 cm)  BEHAVIORAL SYMPTOMS/MOOD NEUROLOGICAL BOWEL NUTRITION STATUS     (none) Continent Diet (Dysphagia level 1(puree) w/ Honey consistency liquids; aspiration precautions)  AMBULATORY STATUS COMMUNICATION OF NEEDS Skin   Extensive Assist Verbally Normal                       Personal Care Assistance Level of Assistance  Bathing, Feeding, Dressing Bathing Assistance: Limited assistance Feeding assistance: Independent Dressing Assistance: Limited assistance     Functional Limitations Info  Sight, Hearing, Speech Sight Info: Adequate Hearing Info: Adequate Speech Info: Adequate    SPECIAL CARE FACTORS FREQUENCY  PT (By licensed PT), OT (By licensed OT)     PT Frequency:  (5) OT Frequency:   (5)            Contractures      Additional Factors Info  Code Status, Allergies Code Status Info:  (DNR ) Allergies Info:  (Ace Inhibitors, Galantamine, Maxidex Dexamethasone, Maxzide Hydrochlorothiazide W-triamterene, Procardia Nifedipine, Zoloft Sertraline Hcl)           Current Medications (12/21/2016):  This is the current hospital active medication list Current Facility-Administered Medications  Medication Dose Route Frequency Provider Last Rate Last Dose  . acetaminophen (TYLENOL) tablet 650 mg  650 mg Oral Q6H PRN Enedina FinnerPatel, Sona, MD       Or  . acetaminophen (TYLENOL) suppository 650 mg  650 mg Rectal Q6H PRN Enedina FinnerPatel, Sona, MD      . albuterol (PROVENTIL) (2.5 MG/3ML) 0.083% nebulizer solution 2.5 mg  2.5 mg Nebulization Q6H PRN Enedina FinnerPatel, Sona, MD      . aspirin suppository 300 mg  300 mg Rectal Daily Enedina FinnerPatel, Sona, MD   300 mg at 12/21/16 1100  . [START ON 12/22/2016] digoxin (LANOXIN) tablet 0.125 mg  0.125 mg Oral Daily Callwood, Dwayne D, MD      . diltiazem (CARDIZEM) tablet 60 mg  60 mg Oral Q6H Callwood, Dwayne D, MD   60 mg at 12/21/16 1416  . heparin ADULT infusion 100 units/mL (25000 units/25350mL sodium chloride 0.45%)  800 Units/hr Intravenous Continuous Adrian SaranMody, Sital, MD 8 mL/hr at 12/21/16 0456 800 Units/hr at 12/21/16 0456  . insulin aspart (novoLOG) injection 0-9 Units  0-9 Units Subcutaneous Q4H Eugenie Norrie, NP   3 Units at 12/21/16 1255  . morphine 2 MG/ML injection 1-2 mg  1-2 mg Intravenous Q4H PRN Eugenie Norrie, NP   1 mg at 12/20/16 1735  . ondansetron (ZOFRAN) tablet 4 mg  4 mg Oral Q6H PRN Enedina Finner, MD       Or  . ondansetron [facility]) injection 4 mg  4 mg Intravenous Q6H PRN Enedina Finner, MD      . piperacillin-tazobactam (ZOSYN) IVPB 3.375 g  3.375 g Intravenous Q12H Valentina Gu, RPH   Stopped at 12/21/16 1200  . polyethylene glycol (MIRALAX / GLYCOLAX) packet 17 g  17 g Oral Daily PRN Enedina Finner, MD         Discharge Medications: Please see  discharge summary for a list of discharge medications.  Relevant Imaging Results:  Relevant Lab Results:   Additional Information  (SSN: [id number])  Sadrac Zeoli, Darleen Crocker, LCSW

## 2016-12-21 NOTE — Progress Notes (Addendum)
ANTICOAGULATION CONSULT NOTE  Pharmacy Consult for Heparin Indication: chest pain/ACS  Allergies  Allergen Reactions  . Ace Inhibitors Swelling  . Galantamine Diarrhea  . Maxidex [Dexamethasone] Other (See Comments)    Reaction:  Unknown   . Maxzide [Hydrochlorothiazide W-Triamterene] Other (See Comments)    Reaction:  Hyponatremia   . Procardia [Nifedipine] Swelling  . Zoloft [Sertraline Hcl] Diarrhea    Patient Measurements: Height: 5\' 3"  (160 cm) Weight: 108 lb 14.5 oz (49.4 kg) IBW/kg (Calculated) : 52.4  Heparin weight 49.4 kg  Vital Signs: Temp: 98.2 F (36.8 C) (09/01 1928) Temp Source: Oral (09/01 1928) BP: 159/70 (09/01 1928) Pulse Rate: 65 (09/01 1928)  Labs:  Recent Labs  12/19/16 1145 12/19/16 1618 12/19/16 2205 12/20/16 0314  12/21/16 0325 12/21/16 1326 12/21/16 2148  HGB 13.7  --   --  12.2  --  12.3  --   --   HCT 40.8  --   --  35.4  --  36.8  --   --   PLT 231  --   --  190  --  217  --   --   APTT  --  115*  --   --   --   --   --   --   LABPROT  --  18.2*  --   --   --   --   --   --   INR  --  1.52  --   --   --   --   --   --   HEPARINUNFRC  --   --  0.38  --   < > 0.29* 0.36 0.46  CREATININE 2.28*  --   --  2.06*  --  2.02*  --   --   TROPONINI 17.94* 22.24* 45.62* 53.89*  --   --   --   --   < > = values in this interval not displayed.  Estimated Creatinine Clearance: 15.6 mL/min (A) (by C-G formula based on SCr of 2.02 mg/dL (H)).   Medical History: Past Medical History:  Diagnosis Date  . Anxiety   . Chronic leg pain   . Dementia   . HTN (hypertension)   . Hypertension   . Hypothyroid   . Raynaud phenomenon     Assessment: 83 y/o F admitted with NSTEMI and no PTA anticoagulant use per RN.   Goal of Therapy:  Heparin level 0.3-0.7 units/ml Monitor platelets by anticoagulation protocol: Yes   Plan:  Give 3000 units bolus x 1 Start heparin infusion at 600 units/hr Check anti-Xa level in 8 hours and daily while on  heparin Continue to monitor H&H and platelets   8/30:  HL @ 22:00 = 0.38 Will continue this pt on current rate and recheck HL in 8 hrs.   8/31: HL @ 0600 = 0.27 Will order heparin 700 units IV X 1 bolus and increase drip rate to 100 units/hr. Will recheck HL 8 hrs after rate change.   8/31 PM: HL at 1657 = 0.33 Will continue this pt on current rate and recheck HL in 8 hrs.   9/1 0300: Heparin level resulted at 0.29, will order Heparin 700 units IV x 1 bolus and increase drip to 800 units/hr. Next Heparin level in 8 hours.  9/1 1325: Heparin level = 0.36, within goal range. RN confirms heparin drip running at 8 ml/hr; no s/sx of bleeding noted.  Will continue current heparin drip rate. Recheck heparin level in  8 hours. Hgb/Plt stable. CBC in AM.   9/1 2200 heparin level 0.46. Continue current regimen. Recheck heparin level and CBC with tomorrow AM labs.  9/2 AM heparin level 0.52. Continue current regimen. Recheck heparin level and CBC with tomorrow AM labs.  Fulton Reek, PharmD, BCPS  12/21/16 10:50 PM

## 2016-12-22 ENCOUNTER — Inpatient Hospital Stay: Payer: Medicare Other

## 2016-12-22 ENCOUNTER — Encounter
Admission: RE | Admit: 2016-12-22 | Discharge: 2016-12-22 | Disposition: A | Discharge status: Home / Self Care | Payer: Medicare Other | Source: Ambulatory Visit | Attending: Internal Medicine | Admitting: Internal Medicine

## 2016-12-22 LAB — CBC
HEMATOCRIT: 35.6 % (ref 35.0–47.0)
Hemoglobin: 12.4 g/dL (ref 12.0–16.0)
MCH: 33.2 pg (ref 26.0–34.0)
MCHC: 34.8 g/dL (ref 32.0–36.0)
MCV: 95.4 fL (ref 80.0–100.0)
PLATELETS: 201 10*3/uL (ref 150–440)
RBC: 3.73 MIL/uL — ABNORMAL LOW (ref 3.80–5.20)
RDW: 13.9 % (ref 11.5–14.5)
WBC: 10.2 10*3/uL (ref 3.6–11.0)

## 2016-12-22 LAB — GLUCOSE, CAPILLARY
GLUCOSE-CAPILLARY: 140 mg/dL — AB (ref 65–99)
Glucose-Capillary: 116 mg/dL — ABNORMAL HIGH (ref 65–99)
Glucose-Capillary: 147 mg/dL — ABNORMAL HIGH (ref 65–99)
Glucose-Capillary: 151 mg/dL — ABNORMAL HIGH (ref 65–99)
Glucose-Capillary: 172 mg/dL — ABNORMAL HIGH (ref 65–99)
Glucose-Capillary: 249 mg/dL — ABNORMAL HIGH (ref 65–99)

## 2016-12-22 LAB — HEPARIN LEVEL (UNFRACTIONATED): Heparin Unfractionated: 0.52 IU/mL (ref 0.30–0.70)

## 2016-12-22 LAB — MAGNESIUM: Magnesium: 2.6 mg/dL — ABNORMAL HIGH (ref 1.7–2.4)

## 2016-12-22 MED ORDER — ENOXAPARIN SODIUM 30 MG/0.3ML ~~LOC~~ SOLN
30.0000 mg | SUBCUTANEOUS | Status: DC
  Administered 2016-12-22 – 2016-12-24 (×3): 30 mg via SUBCUTANEOUS
  Filled 2016-12-22 (×3): qty 0.3

## 2016-12-22 MED ORDER — DILTIAZEM HCL 30 MG PO TABS
120.0000 mg | ORAL_TABLET | Freq: Two times a day (BID) | ORAL | Status: DC
Start: 2016-12-22 — End: 2016-12-24
  Administered 2016-12-22 – 2016-12-24 (×5): 120 mg via ORAL
  Filled 2016-12-22 (×5): qty 4

## 2016-12-22 NOTE — Evaluation (Signed)
Physical Therapy Evaluation Patient Details Name: Mercedes Powell MRN: [id number] DOB: 1928-05-06 Today's Date: 12/22/2016   History of Present Illness  presented to ER secondary to AMS, progressive weakness (unable to ambulate x2 weeks), noted with elevated WBC and troponin; admitted with AMS/encephalopathy, sepsis of unknown etiology, NSTEMI (trop peak 53.89) and a-fib with RVR.  Per cardiology, recommended for conservative, medical management of NSTEMI; on heparin drip currently.  Cleared for participation with therapy by attending physician this date.  Clinical Impression  Upon evaluation, patient alert and oriented to self only; follows approx 25% simple commands, often requiring hand-over-hand to initiate all functional activities.  Very hesitant, fearful of falling; max facilitation/encouragement for functional activities.  Currently requiring mod/max assist for rolling, max/total assist for sit/supine; mod/max assist for unsupported sitting balance with persistent L posterior/lateral trunk lean (question some degree of pushing behaviors towards L); max assist +2 for sit/stand with bilat HHA and with RW.  Poor midline orientation in A/P and M/L planes with both sitting and standing positions; unsafe/unable to attempt additional OOB/mobility tasks at this time as result.  Very high risk for falls; +2 recommended for all attempts at OOB at this time. HR noted in 70s at rest, elevated to 90s with exertional activity.  Does fatigue quickly, but demonstrates no significant indicators of pain or discomfort. Would benefit from skilled PT to address above deficits and promote optimal return to PLOF; recommend transition to STR upon discharge from acute hospitalization, pending ability to consistently and actively participate/progress.     Follow Up Recommendations SNF (pending ability to actively participate/progress)    Equipment Recommendations       Recommendations for Other Services        Precautions / Restrictions Precautions Precautions: Fall Restrictions Weight Bearing Restrictions: No      Mobility  Bed Mobility Overal bed mobility: Needs Assistance Bed Mobility: Supine to Sit;Sit to Supine     Supine to sit: Mod assist;Max assist Sit to supine: Max assist;Total assist      Transfers Overall transfer level: Needs assistance Equipment used: Rolling walker (2 wheeled) Transfers: Sit to/from Stand Sit to Stand: Mod assist;Max assist;+2 physical assistance         General transfer comment: constant assist for hand/foot placement, forward trunk lean, initiation of lift off and static standing balance/midline orientation  Ambulation/Gait             General Gait Details: unsafe/unable  Stairs            Wheelchair Mobility    Modified Rankin (Stroke Patients Only)       Balance Overall balance assessment: Needs assistance Sitting-balance support: No upper extremity supported;Feet supported Sitting balance-Leahy Scale: Poor Sitting balance - Comments: L posterior/lateral trunk lean, question some degree of pushing behaviors (towards L)   Standing balance support: Bilateral upper extremity supported Standing balance-Leahy Scale: Zero Standing balance comment: dep assist +1-2 to maintain static stance                             Pertinent Vitals/Pain Pain Assessment: Faces Faces Pain Scale: Hurts little more Pain Location: R hip Pain Descriptors / Indicators: Grimacing Pain Intervention(s): Limited activity within patient's tolerance;Monitored during session;Repositioned    Home Living Family/patient expects to be discharged to:: Assisted living               Home Equipment: Dan Humphreys - 2 wheels Additional Comments: Per family at bedside, resident of  [facility] x2 years    Prior Function Level of Independence: Independent with assistive device(s)         Comments: Mod indep with RW at baseline;  progressive decline (to be point of being non-ambulatory) over the past two weeks. Has participated with therapy in the home environment with limited progress noted.     Hand Dominance        Extremity/Trunk Assessment   Upper Extremity Assessment Upper Extremity Assessment: Overall WFL for tasks assessed    Lower Extremity Assessment Lower Extremity Assessment: Generalized weakness (grossly 3-/5 throughout, act assist required to initiate; no significant sensory deficits apparent)       Communication      Cognition Arousal/Alertness: Awake/alert Behavior During Therapy: WFL for tasks assessed/performed (motor restless, constantly reaching for things, rolling items in hand) Overall Cognitive Status: Difficult to assess                                 General Comments: oriented to self only; attempts to follow commands, but often requires hand-over-hand to initiate tasks.  Very fearful of movement transitions      General Comments      Exercises Other Exercises Other Exercises: Unsupported sitting, worked on weight shifting activities to promote awareness and accommodation to midline in A/P and M/L planes.  Max assist from therapist to recover LOB and facilitate active attempts to maintain midline.  Poor awareness of deficits; absent attempts at spontaneous correction    Assessment/Plan    PT Assessment Patient needs continued PT services  PT Problem List Decreased strength;Decreased range of motion;Decreased activity tolerance;Decreased balance;Decreased mobility;Decreased coordination;Decreased cognition;Decreased knowledge of use of DME;Decreased safety awareness;Decreased knowledge of precautions       PT Treatment Interventions DME instruction;Gait training;Functional mobility training;Therapeutic activities;Therapeutic exercise;Balance training;Patient/family education    PT Goals (Current goals can be found in the Care Plan section)  Acute Rehab PT  Goals Patient Stated Goal: per family, to try to get stronger and move better; return to ALF if able PT Goal Formulation: With family Time For Goal Achievement: 01/05/17 Potential to Achieve Goals: Fair Additional Goals Additional Goal #1: Assess and establish goals for OOB activities as appropriate.    Frequency Min 2X/week   Barriers to discharge Decreased caregiver support      Co-evaluation               AM-PAC PT "6 Clicks" Daily Activity  Outcome Measure Difficulty turning over in bed (including adjusting bedclothes, sheets and blankets)?: Unable Difficulty moving from lying on back to sitting on the side of the bed? : Unable Difficulty sitting down on and standing up from a chair with arms (e.g., wheelchair, bedside commode, etc,.)?: Unable Help needed moving to and from a bed to chair (including a wheelchair)?: Total Help needed walking in hospital room?: Total Help needed climbing 3-5 steps with a railing? : Total 6 Click Score: 6    End of Session Equipment Utilized During Treatment: Gait belt Activity Tolerance: Patient limited by fatigue Patient left: in bed;with call bell/phone within reach;with bed alarm set;with family/visitor present Nurse Communication: Mobility status PT Visit Diagnosis: Muscle weakness (generalized) (M62.81);Difficulty in walking, not elsewhere classified (R26.2)    Time: [id number] PT Time Calculation (min) (ACUTE ONLY): 31 min   Charges:   PT Evaluation $PT Eval Moderate Complexity: 1 Mod PT Treatments $Therapeutic Activity: 8-22 mins   PT G Codes:   PT  G-Codes **NOT FOR INPATIENT CLASS** Functional Assessment Tool Used: AM-PAC 6 Clicks Basic Mobility Functional Limitation: Mobility: Walking and moving around Mobility: Walking and Moving Around Current Status [phone]): At least 80 percent but less than 100 percent impaired, limited or restricted Mobility: Walking and Moving Around Goal Status [phone]): At least 20 percent but  less than 40 percent impaired, limited or restricted    Andreina Outten H. Manson Passey, PT, DPT, NCS 12/22/16, 10:06 AM [phone]

## 2016-12-22 NOTE — Progress Notes (Signed)
Pt confused all day. Refusing mouth care and refusing to eat. However at suppertime she ate entire magic cup and 11/4 of her milk and some ice tea. Passed large formed black stool. Son here most of day. Pt resting quietly at this time.

## 2016-12-22 NOTE — Progress Notes (Signed)
[facility] [place] [facility]   PATIENT NAME: Mercedes LentoBetty Powell    MR#:  [id number]  DATE OF BIRTH:  12/09/1928  SUBJECTIVE:   Patient had another asymptomatic sinus pause less then 3 seconds last night. Daughter in law at bedside  REVIEW OF SYSTEMS:    Unable to perform dementia  Tolerating Diet: yes     DRUG ALLERGIES:   Allergies  Allergen Reactions  . Ace Inhibitors Swelling  . Galantamine Diarrhea  . Maxidex [Dexamethasone] Other (See Comments)    Reaction:  Unknown   . Maxzide [Hydrochlorothiazide W-Triamterene] Other (See Comments)    Reaction:  Hyponatremia   . Procardia [Nifedipine] Swelling  . Zoloft [Sertraline Hcl] Diarrhea    VITALS:  Blood pressure (!) 186/84, pulse 78, temperature 97.7 F (36.5 C), temperature source Axillary, resp. rate 18, height 5\' 3"  (1.6 m), weight 49.4 kg (108 lb 14.5 oz), SpO2 95 %.  PHYSICAL EXAMINATION:  Constitutional: Appears Ill appearing patient has dementia  No distress. HENT: Normocephalic. Marland Kitchen. Oropharynx is clear and moist.  Eyes: Conjunctivae and EOM are normal. PERRLA, no scleral icterus.  Neck: Normal ROM. Neck supple. No JVD. No tracheal deviation. CVS: irr,irr, no murmurs, no gallops, no carotid bruit.  Pulmonary: Effort and breath sounds normal, no stridor, rhonchi, wheezes, rales.  Abdominal: Soft. BS +,  no distension, tenderness, rebound or guarding.  Musculoskeletal:generalized weakness. No edema and no tenderness.  Neuro: Follows commands  Skin: Skin is warm and dry. No rash noted. Psychiatric: flat affect. dementia     LABORATORY PANEL:   CBC  Recent Labs Lab 12/22/16 0435  WBC 10.2  HGB 12.4  HCT 35.6  PLT 201   ------------------------------------------------------------------------------------------------------------------  Chemistries   Recent Labs Lab 12/19/16 1145  12/21/16 0325  NA 142  < > 148*  K 4.1  < > 3.9  CL 107  < > 120*  CO2 20*  < > 20*  GLUCOSE  271*  < > 149*  BUN 62*  < > 69*  CREATININE 2.28*  < > 2.02*  CALCIUM 9.8  < > 9.5  MG  --   --  2.6*  AST 109*  --   --   ALT 37  --   --   ALKPHOS 72  --   --   BILITOT 1.4*  --   --   < > = values in this interval not displayed. ------------------------------------------------------------------------------------------------------------------  Cardiac Enzymes  Recent Labs Lab 12/19/16 1618 12/19/16 2205 12/20/16 0314  TROPONINI 22.24* 45.62* 53.89*   ------------------------------------------------------------------------------------------------------------------  RADIOLOGY:  No results found.   ASSESSMENT AND PLAN:   83 year old female with chronic dementia, hypothyroidism who was brought to the emergency room due to patient being less communicative and not ambulating.  1. Acute metabolic encephalopathy due to myocardial infarction and sepsis of unclear etiology On top of her baseline dementia  2. Acute MI: Troponin max 53.89 Patient evaluated by cardiology and due to underlying severe dementia and acute on chronic kidney disease recommendations are for conservative management Will stop heparin gtt. It has been 48 hours. Continue aspirin and Dilt Holding Metoprolol due to sinus pause.  3. Sepsis Has been ruled out with negative Chest x-ray and UA. Blood cultures are negative so will stop Zosyn.  4. New onset atrial fibrillation with RVR: Continue diltiazem Echocardiogram shows ejection fraction 45-50% without valvular abnormalities no wall motion abnormalities Appreciate cardiology consultation  5. Acute on chronic kidney disease stage III from sepsis avoid nephrotoxic  agents BMP for a.m.  6. Progressive dementia: Patient will benefit from palliative care consultation as an outpatient  Overall very poor prognosis Discussed with patient's daughter-in-law and son. Physical therapy consultation today Son would also like imaging of pelvis as she has had  fractures in the past.    CODE STATUS: DNR  TOTAL TIME TAKING CARE OF THIS PATIENT: 27 minutes.     POSSIBLE D/C tomorrow DEPENDING ON CLINICAL CONDITION.   Kemari Narez M.D on 12/22/2016 at 9:21 AM  Between 7am to 6pm - Pager - [phone] After 6pm go to [email or web address] - password [email or web address]  [facility]  Office  [phone]  CC: Primary care physician; Danella Penton, MD  Note: This dictation was prepared with Dragon dictation along with smaller phrase technology. Any transcriptional errors that result from this process are unintentional.

## 2016-12-22 NOTE — Clinical Social Work Note (Signed)
CSW spoke to patient's son [name] [phone] to find out if he would like patient to go to SNF to see if she can get some therapy, or if he would rather have patient return to [facility].  [name] said he would like to discuss with a physician what they think is best.  CSW spoke to [facility], and they said patient is close to her baseline and she can return back to ALF if family wishes or if they would like her to go to SNF that is okay too.  Patient's son would like to speak with physician to discuss discharge plan.  CSW to continue to follow patient's progress throughout discharge planning.  Ervin KnackEric R. Elliott Quade, MSW, Theresia MajorsLCSWA [phone]  12/22/2016 4:44 PM

## 2016-12-22 NOTE — Progress Notes (Signed)
Patient had 2.39 seconds pause this shift and she's asymptomatic. Dr. Emmit PomfretHugelmeyer notified with a new order to draw Mg level. Will  continue to monitor.

## 2016-12-22 NOTE — Progress Notes (Addendum)
Mg level =2.6, Dr. Tobi BastosPyreddy notified without any new order but to D/C all beta blockers and digoxin. Will continue to monitor.

## 2016-12-23 LAB — CBC
HCT: 40.3 % (ref 35.0–47.0)
Hemoglobin: 13.3 g/dL (ref 12.0–16.0)
MCH: 32.1 pg (ref 26.0–34.0)
MCHC: 33 g/dL (ref 32.0–36.0)
MCV: 97.1 fL (ref 80.0–100.0)
PLATELETS: 220 10*3/uL (ref 150–440)
RBC: 4.15 MIL/uL (ref 3.80–5.20)
RDW: 14.3 % (ref 11.5–14.5)
WBC: 12.9 10*3/uL — ABNORMAL HIGH (ref 3.6–11.0)

## 2016-12-23 LAB — BASIC METABOLIC PANEL
Anion gap: 10 (ref 5–15)
BUN: 68 mg/dL — AB (ref 6–20)
CO2: 22 mmol/L (ref 22–32)
Calcium: 9.9 mg/dL (ref 8.9–10.3)
Chloride: 126 mmol/L — ABNORMAL HIGH (ref 101–111)
Creatinine, Ser: 1.94 mg/dL — ABNORMAL HIGH (ref 0.44–1.00)
GFR calc Af Amer: 26 mL/min — ABNORMAL LOW (ref >60)
GFR, EST NON AFRICAN AMERICAN: 22 mL/min — AB (ref >60)
GLUCOSE: 164 mg/dL — AB (ref 65–99)
POTASSIUM: 3.6 mmol/L (ref 3.5–5.1)
Sodium: 158 mmol/L — ABNORMAL HIGH (ref 135–145)

## 2016-12-23 LAB — GLUCOSE, CAPILLARY
GLUCOSE-CAPILLARY: 171 mg/dL — AB (ref 65–99)
Glucose-Capillary: 143 mg/dL — ABNORMAL HIGH (ref 65–99)
Glucose-Capillary: 147 mg/dL — ABNORMAL HIGH (ref 65–99)
Glucose-Capillary: 154 mg/dL — ABNORMAL HIGH (ref 65–99)
Glucose-Capillary: 160 mg/dL — ABNORMAL HIGH (ref 65–99)
Glucose-Capillary: 186 mg/dL — ABNORMAL HIGH (ref 65–99)
Glucose-Capillary: 205 mg/dL — ABNORMAL HIGH (ref 65–99)

## 2016-12-23 MED ORDER — MORPHINE SULFATE (CONCENTRATE) 10 MG/0.5ML PO SOLN: 5.0000 mg | ORAL | 0 refills | Status: DC | PRN

## 2016-12-23 MED ORDER — LORAZEPAM 2 MG/ML PO CONC: 0.5000 mg | ORAL | Status: DC | PRN

## 2016-12-23 MED ORDER — METOPROLOL TARTRATE 25 MG PO TABS: 25.0000 mg | ORAL_TABLET | Freq: Two times a day (BID) | ORAL | 0 refills | Status: DC

## 2016-12-23 MED ORDER — NYSTATIN 100000 UNIT/ML MT SUSP: 5.0000 mL | Freq: Four times a day (QID) | OROMUCOSAL | 0 refills | Status: DC

## 2016-12-23 MED ORDER — DILTIAZEM HCL 120 MG PO TABS: 120.0000 mg | ORAL_TABLET | Freq: Two times a day (BID) | ORAL | 0 refills | Status: DC

## 2016-12-23 MED ORDER — DEXTROSE 5 % IV SOLN
INTRAVENOUS | Status: AC
  Administered 2016-12-23 – 2016-12-24 (×2): via INTRAVENOUS

## 2016-12-23 MED ORDER — HYDRALAZINE HCL 20 MG/ML IJ SOLN
10.0000 mg | INTRAMUSCULAR | Status: DC | PRN
  Administered 2016-12-23 – 2016-12-24 (×2): 10 mg via INTRAVENOUS
  Filled 2016-12-23 (×2): qty 1

## 2016-12-23 MED ORDER — METOPROLOL TARTRATE 25 MG PO TABS
25.0000 mg | ORAL_TABLET | Freq: Two times a day (BID) | ORAL | Status: DC
  Administered 2016-12-23 – 2016-12-24 (×3): 25 mg via ORAL
  Filled 2016-12-23 (×3): qty 1

## 2016-12-23 MED ORDER — MORPHINE SULFATE (PF) 2 MG/ML IV SOLN: 1.0000 mg | INTRAVENOUS | Status: DC | PRN

## 2016-12-23 MED ORDER — NYSTATIN 100000 UNIT/ML MT SUSP
5.0000 mL | Freq: Four times a day (QID) | OROMUCOSAL | Status: DC
  Administered 2016-12-23 – 2016-12-24 (×6): 500000 [IU] via OROMUCOSAL
  Filled 2016-12-23 (×6): qty 5

## 2016-12-23 MED ORDER — MORPHINE SULFATE (CONCENTRATE) 10 MG/0.5ML PO SOLN: 5.0000 mg | ORAL | Status: DC | PRN

## 2016-12-23 NOTE — Progress Notes (Signed)
Daily Progress Note   Patient Name: Mercedes Powell       Date: 12/23/2016 DOB: January 11, 1929  Age: 83 y.o. MRN#: [id number] Attending Physician: Adrian Saran, MD Primary Care Physician: Danella Penton, MD Admit Date: 12/19/2016  Reason for Consultation/Follow-up: Establishing goals of care  Subjective/GOC: Upon arrival to room, patient resting. No s/s of pain or discomfort.   DIL, [name], at bedside. We again discussed diagnoses, interventions, and underlying co-morbidities. Discussed guarded prognosis with functional and nutritional decline and high risk for decompensation secondary to recent MI/not a candidate for invasive cardiac workup. [name] understands and states "I do not want to see her suffer." She tells me she is not able to work with physical therapy because "she hurts when repositioned." [name] tells me the patient's four sons have visited the hospital this weekend and also have a good understanding of guarded prognosis. Alecia Lemming is considering SNF for physical therapy. [name] feels this is not a good option "why make her suffer?"   Educated on hospice services on discharge with focus on comfort, quality, and dignity at EOL. Educated on medications as needed to control symptoms. [name] would like her mother to return to ALF with hospice if they will accept. "This is her home." Maren Reamer is going to discuss hospice services with son, Alecia Lemming.   Therapeutic listening and emotional support provided.   Length of Stay: 4  Current Medications: Scheduled Meds:  . aspirin  300 mg Rectal Daily  . diltiazem  120 mg Oral Q12H  . enoxaparin (LOVENOX) injection  30 mg Subcutaneous Q24H  . insulin aspart  0-9 Units Subcutaneous Q4H  . metoprolol tartrate  25 mg Oral BID    Continuous  Infusions: . dextrose      PRN Meds: acetaminophen **OR** acetaminophen, albuterol, hydrALAZINE, LORazepam, morphine injection, morphine CONCENTRATE, ondansetron **OR** ondansetron (ZOFRAN) IV, polyethylene glycol  Physical Exam  Constitutional: She is easily aroused. She appears ill.  HENT:  Head: Normocephalic and atraumatic.  Cardiovascular: An irregularly irregular rhythm present.  Pulmonary/Chest: No accessory muscle usage. No tachypnea. No respiratory distress.  Abdominal: Normal appearance.  Neurological: She is easily aroused. She is disoriented.  Skin: Skin is warm and dry. There is pallor.  Psychiatric: Cognition and memory are impaired. She is inattentive.  Nursing note and vitals reviewed.  Vital Signs: BP (!) 182/69 (BP Location: Right Arm)   Pulse 94   Temp 97.8 F (36.6 C) (Oral)   Resp 18   Ht 5\' 3"  (1.6 m)   Wt 49.4 kg (108 lb 14.5 oz)   SpO2 100%   BMI 19.29 kg/m  SpO2: SpO2: 100 % O2 Device: O2 Device: Nasal Cannula O2 Flow Rate: O2 Flow Rate (L/min): 2 L/min  Intake/output summary:  Intake/Output Summary (Last 24 hours) at 12/23/16 0954 Last data filed at 12/23/16 [id number]  Gross per 24 hour  Intake              150 ml  Output              701 ml  Net             -551 ml   LBM: Last BM Date: 12/22/16 Baseline Weight: Weight: 49.9 kg (110 lb) Most recent weight: Weight: 49.4 kg (108 lb 14.5 oz)       Palliative Assessment/Data: PPS 30%   Flowsheet Rows     Most Recent Value  Intake Tab  Referral Department  Hospitalist  Unit at Time of Referral  ICU  Palliative Care Primary Diagnosis  Cardiac  Palliative Care Type  New Palliative care  Reason for referral  Clarify Goals of Care  Date first seen by Palliative Care  12/20/16  Clinical Assessment  Palliative Performance Scale Score  30%  Psychosocial & Spiritual Assessment  Palliative Care Outcomes  Patient/Family meeting held?  Yes  Who was at the meeting?  daughter-in-law, sister,  son  Palliative Care Outcomes  Clarified goals of care, Provided end of life care assistance, Provided psychosocial or spiritual support, ACP counseling assistance, Counseled regarding hospice      Patient Active Problem List   Diagnosis Date Noted  . Elevated troponin   . Sepsis (HCC)   . Acute kidney injury (HCC)   . Alzheimer's dementia without behavioral disturbance   . Palliative care by specialist   . Goals of care, counseling/discussion   . Acute non-Q wave non-ST elevation myocardial infarction (NSTEMI) (HCC) 12/19/2016  . Hip fracture (HCC) 04/10/2015    Palliative Care Assessment & Plan   Patient Profile: 83 y.o. female  with past medical history of Alzheimer's, hypertension, hypothyroidism, chronic leg pain, and anxiety admitted on 12/19/2016 with altered mental status. Per sister and daughter-in-law, patient has baseline dementia and has been declining for 4 weeks with increased weakness and poor appetite. Seen by PCP this week. CT negative for acute stroke. In ED, patient with WBC 17, lactic acid 3.2, BNP 3300, and troponin of 17. EKG revealed acute non-Q wave MI. Also with acute renal failure and afib RVR. Cardiology consulted in ED. Heparin and Cardizem infusions and antibiotics initiated. Troponin trending up (53.89). Medical management. Not a candidate for invasive cardiac workup. Poor prognosis per attending. Palliative medicine consultation for goals of care. Remains with poor appetite and decrease in functional/cognitive status.   Assessment: Acute metabolic encephalopathy Myocardial infarction Afib RVR Acute on chronic kidney disease stage III Progressive dementia  Recommendations/Plan:  Daughter-in-law to speak with son regarding hospice services on discharge. Possibly back to ALF if they will accept.   Symptom management  Roxanol 5mg  SL q1h prn pain/dyspnea/air hunger  Ativan 0.5mg  PO q4h prn anxiety/agitation  Goals of Care and Additional  Recommendations:  Limitations on Scope of Treatment: DNR/DNI. Leaning towards comfort/hospice on discharge.   Code Status: DNR/DNI   Code Status Orders  Start     Ordered   12/19/16 1528  Do not attempt resuscitation (DNR)  Continuous    Question Answer Comment  In the event of cardiac or respiratory ARREST Do not call a "code blue"   In the event of cardiac or respiratory ARREST Do not perform Intubation, CPR, defibrillation or ACLS   In the event of cardiac or respiratory ARREST Use medication by any route, position, wound care, and other measures to relive pain and suffering. May use oxygen, suction and manual treatment of airway obstruction as needed for comfort.      12/19/16 1527    Code Status History    Date Active Date Inactive Code Status Order ID Comments User Context   12/19/2016  2:13 PM 12/19/2016  3:27 PM DNR [id number]  Enedina Finner, MD ED   05/06/2015 12:48 AM 05/06/2015 10:51 AM Full Code [id number]  Deeann Saint, MD ED   04/11/2015  3:18 PM 04/13/2015  8:06 PM Full Code [id number]  Juanell Fairly, MD Inpatient   04/10/2015  5:43 PM 04/10/2015  5:43 PM Full Code [id number]  Juanell Fairly, MD Inpatient   04/10/2015  5:28 PM 04/10/2015  5:43 PM Full Code [id number]  Auburn Bilberry, MD Inpatient       Prognosis:   < 4 weeks: if not less with acute MI, acute on chronic kidney disease, progressive dementia, and severe decrease in functional/nutritional/cognitive status.   Discharge Planning:  To Be Determined: likely back to ALF with hospice services  Care plan was discussed with daughter-in-law ([name]), Dr. Juliene Pina, RN CM, SW  Thank you for allowing the Palliative Medicine Team to assist in the care of this patient.   Time In: 0925 Time Out: 1000 Total Time Prolonged Time Billed  no      Greater than 50%  of this time was spent counseling and coordinating care related to the above assessment and plan.  Vennie Homans, FNP-C Palliative Medicine  Team  Phone: [phone] Fax: [phone]  Please contact Palliative Medicine Team phone at [phone] for questions and concerns.

## 2016-12-23 NOTE — Clinical Social Work Note (Addendum)
[facility], they are not able to get oxygen or a hospital for bed until tomorrow.  CSW updated physician, case manager, and patient's family.  [facility] [phone].  CSW to continue to follow patient's progress throughout discharge planning.  1:45pm  [facility] said [facility] will [facility] patient back tomorrow once oxygen has been delivered.  Mercedes KnackEric R. Kinzee Powell, MSW, Mercedes MajorsLCSWA [phone]  12/23/2016 1:12 PM

## 2016-12-23 NOTE — Progress Notes (Signed)
  Speech Language Pathology Treatment: Dysphagia  Patient Details Name: Mercedes Powell MRN: [id number] DOB: 06/02/1928 Today's Date: 12/23/2016 Time: [id number] SLP Time Calculation (min) (ACUTE ONLY): 35 min  Assessment / Plan / Recommendation Clinical Impression  Pt seen today for toleration of diet; education on aspiration precautions and support w/ meals; trials to upgrade diet consistency as able. Pt continues to present w/ declined cognitive status w/ moderate cues needed for follow through w/ tasks. Sitter stated pt has not been taking much po's; dentures are being cleaned. Noted a coating presentation on tongue which SLP informed MD of to request tx for potential thrush. No overt s/s of aspiration have been noted w/ current dysphagia diet consistency. Pt consumed TSP trials of Nectar liquids (upgrade) w/ no overt s/s of aspiration noted; no decline in respiratory status during/post trials. Pt exhibited min slower oral phase w/ bolus management but transferred and swallowed/cleared each trial given. Pt was slow to follow through w/ all tasks w/ moderate cues needed but agreeable; when asked if she wanted some other trials such as ice cream, she said "no". Encouraged her to try later at her meal.  Recommend diet upgrade to Dysphagia level 1(puree) w/ Nectar consistency liquids; aspiration precautions; feeding support. Monitor for oral status/oral care and clearing of coating on tongue. ST services to f/u w/ pt's toleration of diet and trials to upgrade when appropriate; [facility]/ family[facility] updated.    HPI HPI: Pt  is a 83 y.o. female with a known history of chronic dementia,anxiety, hypertension, hypothyroidism comes to the emergency room from home place a copy by family members. According to the family ( sister and daughter-in-law) patient has been overall declining for last 4 weeks. She has been less communicative and not ambulating and a poor appetite. She was seen 2 days ago at  primary care physician's office and a CT head was done to rule out stroke. CT head was negative. Patient was admitted for evaluation and management of acute non-Q-wave MI, rapid A. Fib with RVR, sepsis, acute renal failure. Pt has not been taking much po per staff report. Noted pt's tongue looks as if it could have some coating to it.      SLP Plan  Continue with current plan of care       Recommendations  Diet recommendations: Dysphagia 1 (puree);Nectar-thick liquid Liquids provided via: Teaspoon;Cup (monitored carefully) Medication Administration: Crushed with puree Supervision: Staff to assist with self feeding;Full supervision/cueing for compensatory strategies Compensations: Minimize environmental distractions;Slow rate;Small sips/bites;Lingual sweep for clearance of pocketing;Multiple dry swallows after each bite/sip;Follow solids with liquid Postural Changes and/or Swallow Maneuvers: Seated upright 90 degrees;Upright 30-60 min after meal                General recommendations:  (Dietician f/u) Oral Care Recommendations: Oral care BID;Staff/trained caregiver to provide oral care Follow up Recommendations: Skilled Nursing facility SLP Visit Diagnosis: Dysphagia, oropharyngeal phase (R13.12) Plan: Continue with current plan of care       GO               Jerilynn SomKatherine Watson, MS, CCC-SLP Watson,Katherine 12/23/2016, 1:51 PM

## 2016-12-23 NOTE — [place] FL2 (Signed)
[place] MEDICAID FL2 LEVEL OF CARE SCREENING TOOL     IDENTIFICATION  Patient Name: Mercedes Powell Birthdate: 1929-03-01 Sex: female Admission Date (Current Location): 12/19/2016  [place]   Facility and Address:  [facility], [address], [place], [place] [address]      Provider Number: [id number]  Attending Physician Name and Address:  Adrian SaranMody, Sital, MD  Relative Name and Phone Number:       Current Level of Care: Hospital Recommended Level of Care: Assisted Living Facility Prior Approval Number:    Date Approved/Denied:   PASRR Number:  ([phone] )  Discharge Plan: Domiciliary (Rest home) ([facility])    Current Diagnoses: Patient Active Problem List   Diagnosis Date Noted  . Elevated troponin   . Sepsis (HCC)   . Acute kidney injury (HCC)   . Alzheimer's dementia without behavioral disturbance   . Palliative care by specialist   . Goals of care, counseling/discussion   . Acute non-Q wave non-ST elevation myocardial infarction (NSTEMI) (HCC) 12/19/2016  . Hip fracture (HCC) 04/10/2015    Orientation RESPIRATION BLADDER Height & Weight     Self  O2 (2L) Incontinent Weight: 108 lb 14.5 oz (49.4 kg) Height:  5\' 3"  (160 cm)  BEHAVIORAL SYMPTOMS/MOOD NEUROLOGICAL BOWEL NUTRITION STATUS     (none) Continent Diet (Dysphagia 1 diet)  AMBULATORY STATUS COMMUNICATION OF NEEDS Skin   Extensive Assist Verbally Normal                       Personal Care Assistance Level of Assistance  Bathing, Feeding, Dressing Bathing Assistance: Limited assistance Feeding assistance: Limited assistance Dressing Assistance: Limited assistance     Functional Limitations Info  Sight, Hearing, Speech Sight Info: Adequate Hearing Info: Adequate Speech Info: Adequate    SPECIAL CARE FACTORS FREQUENCY  PT (By licensed PT), OT (By licensed OT)     PT Frequency: 2x a week OT Frequency:  (5)             Contractures Contractures Info: Not present    Additional Factors Info  Code Status, Allergies Code Status Info: DNR Allergies Info: ACE INHIBITORS, GALANTAMINE, MAXIDEX DEXAMETHASONE, MAXZIDE HYDROCHLOROTHIAZIDE W-TRIAMTERENE, PROCARDIA NIFEDIPINE, ZOLOFT SERTRALINE HCL            Current Medications (12/23/2016):  This is the current hospital active medication list Current Facility-Administered Medications  Medication Dose Route Frequency Provider Last Rate Last Dose  . acetaminophen (TYLENOL) tablet 650 mg  650 mg Oral Q6H PRN Enedina FinnerPatel, Sona, MD       Or  . acetaminophen (TYLENOL) suppository 650 mg  650 mg Rectal Q6H PRN Enedina FinnerPatel, Sona, MD      . albuterol (PROVENTIL) (2.5 MG/3ML) 0.083% nebulizer solution 2.5 mg  2.5 mg Nebulization Q6H PRN Enedina FinnerPatel, Sona, MD      . aspirin suppository 300 mg  300 mg Rectal Daily Enedina FinnerPatel, Sona, MD   300 mg at 12/23/16 1117  . dextrose 5 % solution   Intravenous Continuous Adrian SaranMody, Sital, MD 75 mL/hr at 12/23/16 1014    . diltiazem (CARDIZEM) tablet 120 mg  120 mg Oral Q12H Adrian SaranMody, Sital, MD   120 mg at 12/23/16 0833  . enoxaparin (LOVENOX) injection 30 mg  30 mg Subcutaneous Q24H Mody, Sital, MD   30 mg at 12/23/16 1023  . hydrALAZINE (APRESOLINE) injection 10 mg  10 mg Intravenous Q4H PRN Ihor AustinPyreddy, Pavan, MD   10 mg at 12/23/16  1610  . insulin aspart (novoLOG) injection 0-9 Units  0-9 Units Subcutaneous Q4H Eugenie Norrie, NP   1 Units at 12/23/16 0801  . LORazepam (ATIVAN) 2 MG/ML concentrated solution 0.5 mg  0.5 mg Oral Q4H PRN Alita Chyle, NP      . metoprolol tartrate (LOPRESSOR) tablet 25 mg  25 mg Oral BID Adrian Saran, MD   25 mg at 12/23/16 1017  . morphine 2 MG/ML injection 1 mg  1 mg Intravenous Q2H PRN Alita Chyle, NP      . morphine CONCENTRATE 10 MG/0.5ML oral solution 5 mg  5 mg Sublingual Q1H PRN Alita Chyle, NP      . nystatin (MYCOSTATIN) 100000 UNIT/ML suspension 500,000 Units  5 mL Mouth/Throat QID Adrian Saran, MD   500,000 Units at  12/23/16 1123  . ondansetron (ZOFRAN) tablet 4 mg  4 mg Oral Q6H PRN Enedina Finner, MD       Or  . ondansetron [facility]) injection 4 mg  4 mg Intravenous Q6H PRN Enedina Finner, MD      . polyethylene glycol (MIRALAX / GLYCOLAX) packet 17 g  17 g Oral Daily PRN Enedina Finner, MD         Discharge Medications: Please see discharge summary for a list of discharge medications.  Current Discharge Medication List        START taking these medications   Details  diltiazem (CARDIZEM) 120 MG tablet Take 1 tablet (120 mg total) by mouth every 12 (twelve) hours. Qty: 60 tablet, Refills: 0    metoprolol tartrate (LOPRESSOR) 25 MG tablet Take 1 tablet (25 mg total) by mouth 2 (two) times daily. Qty: 60 tablet, Refills: 0    Morphine Sulfate (MORPHINE CONCENTRATE) 10 MG/0.5ML SOLN concentrated solution Place 0.25 mLs (5 mg total) under the tongue every hour as needed for moderate pain, severe pain or shortness of breath (air hunger). Qty: 30 mL, Refills: 0    nystatin (MYCOSTATIN) 100000 UNIT/ML suspension Use as directed 5 mLs (500,000 Units total) in the mouth or throat 4 (four) times daily. Qty: 60 mL, Refills: 0          CONTINUE these medications which have NOT CHANGED   Details  acetaminophen (TYLENOL) 325 MG tablet Take 650 mg by mouth as needed for mild pain, moderate pain or fever.    levothyroxine (SYNTHROID, LEVOTHROID) 75 MCG tablet Take 75 mcg by mouth daily before breakfast.    polyvinyl alcohol-povidone (HYPOTEARS) 1.4-0.6 % ophthalmic solution Place 1 drop into both eyes 4 (four) times daily.         STOP taking these medications     aspirin EC 81 MG tablet      Calcium 500 MG CHEW      Cholecalciferol (VITAMIN D) 2000 UNITS CAPS      hydrALAZINE (APRESOLINE) 50 MG tablet      Multiple Vitamin (MULTIVITAMIN WITH MINERALS) TABS tablet      amLODipine (NORVASC) 5 MG tablet      oxyCODONE (OXY IR/ROXICODONE) 5 MG immediate release tablet         Relevant Imaging Results:  Relevant Lab Results:   Additional Information SSN [id number]  Darleene Cleaver, [place]

## 2016-12-23 NOTE — Discharge Summary (Addendum)
[facility] - [place] at [facility]   PATIENT NAME: Mercedes Powell    MR#:  [id number]  DATE OF BIRTH:  1929/04/15  DATE OF ADMISSION:  12/19/2016 ADMITTING PHYSICIAN: Enedina Finner, MD  DATE OF DISCHARGE:  12/24/2016  PRIMARY CARE PHYSICIAN: Danella Penton, MD    ADMISSION DIAGNOSIS:  Dyspnea [R06.00] Elevated troponin [R74.8] Sepsis, due to unspecified organism (HCC) [A41.9] Altered mental status, unspecified altered mental status type [R41.82]  DISCHARGE DIAGNOSIS:  Active Problems:   Acute non-Q wave non-ST elevation myocardial infarction (NSTEMI) (HCC) Acute encephalopathy, metabolic   Sepsis (HCC)Has been ruled out   Acute kidney injury (HCC)   Alzheimer's dementia without behavioral disturbance   Palliative care by specialist   Goals of care, counseling/discussion   SECONDARY DIAGNOSIS:   Past Medical History:  Diagnosis Date  . Anxiety   . Chronic leg pain   . Dementia   . HTN (hypertension)   . Hypertension   . Hypothyroid   . Raynaud phenomenon     HOSPITAL COURSE:  83 year old female with chronic dementia, hypothyroidism who was brought to the emergency room due to patient being less communicative and not ambulating.  1. Acute metabolic encephalopathy due to myocardial infarction   2. Acute MI: Troponin max 53.89 Patient evaluated by cardiology and due to underlying severe dementia and acute on chronic kidney disease recommendations are for conservative management  Continue aspirin, metoprolol and Dilt as tolerated   3. Sepsis Has been ruled out with negative Chest x-ray and UA. Blood cultures are negative antibiotics have been discontinued 4. New onset atrial fibrillation with RVR: Heart rate is being controlled with diltiazem and metoprolol. Echocardiogram shows ejection fraction 45-50% without valvular abnormalities no wall motion abnormalities   5. Acute on chronic kidney disease stage III avoid nephrotoxic agents   6.  Progressive dementia: Over the course of her hospital stay. Patient had progressive decline in her status. Her sodium level was high due to poor appetite and by mouth intake. After discussion with palliative care and patient's family, recommendations were for hospice. Family is in full agreement   DISCHARGE CONDITIONS AND DIET:   Guarded condition diet as tolerated  CONSULTS OBTAINED:    DRUG ALLERGIES:   Allergies  Allergen Reactions  . Ace Inhibitors Swelling  . Galantamine Diarrhea  . Maxidex [Dexamethasone] Other (See Comments)    Reaction:  Unknown   . Maxzide [Hydrochlorothiazide W-Triamterene] Other (See Comments)    Reaction:  Hyponatremia   . Procardia [Nifedipine] Swelling  . Zoloft [Sertraline Hcl] Diarrhea    DISCHARGE MEDICATIONS:   Current Discharge Medication List    START taking these medications   Details  diltiazem (CARDIZEM) 120 MG tablet Take 1 tablet (120 mg total) by mouth every 12 (twelve) hours. Qty: 60 tablet, Refills: 0    metoprolol tartrate (LOPRESSOR) 25 MG tablet Take 1 tablet (25 mg total) by mouth 2 (two) times daily. Qty: 60 tablet, Refills: 0    Morphine Sulfate (MORPHINE CONCENTRATE) 10 MG/0.5ML SOLN concentrated solution Place 0.25 mLs (5 mg total) under the tongue every hour as needed for moderate pain, severe pain or shortness of breath (air hunger). Qty: 30 mL, Refills: 0    nystatin (MYCOSTATIN) 100000 UNIT/ML suspension Use as directed 5 mLs (500,000 Units total) in the mouth or throat 4 (four) times daily. Qty: 60 mL, Refills: 0      CONTINUE these medications which have NOT CHANGED   Details  acetaminophen (TYLENOL) 325 MG tablet Take  650 mg by mouth as needed for mild pain, moderate pain or fever.    levothyroxine (SYNTHROID, LEVOTHROID) 75 MCG tablet Take 75 mcg by mouth daily before breakfast.    polyvinyl alcohol-povidone (HYPOTEARS) 1.4-0.6 % ophthalmic solution Place 1 drop into both eyes 4 (four) times daily.       STOP taking these medications     aspirin EC 81 MG tablet      Calcium 500 MG CHEW      Cholecalciferol (VITAMIN D) 2000 UNITS CAPS      hydrALAZINE (APRESOLINE) 50 MG tablet      Multiple Vitamin (MULTIVITAMIN WITH MINERALS) TABS tablet      amLODipine (NORVASC) 5 MG tablet      oxyCODONE (OXY IR/ROXICODONE) 5 MG immediate release tablet           Today   CHIEF COMPLAINT:   Patient opens her eyes but is not speaking today.  VITAL SIGNS:  Blood pressure (!) 176/82, pulse 84, temperature (!) 97.5 F (36.4 C), temperature source Oral, resp. rate 20, height 5\' 3"  (1.6 m), weight 51.8 kg (114 lb 1.6 oz), SpO2 96 %.   REVIEW OF SYSTEMS:  Review of Systems  Unable to perform ROS: Dementia     PHYSICAL EXAMINATION:  GENERAL:  83 y.o.-year-old patient lying in the bed Does not appear to be in respiratory distress NECK:  Supple, no jugular venous distention. No thyroid enlargement, no tenderness.  LUNGS: Normal breath sounds bilaterally, positive for bilateral rhonchi   No use of accessory muscles of respiration.  CARDIOVASCULAR: S1, S2 normal. No murmurs, rubs, or gallops.  ABDOMEN: Soft, non-tender, non-distended. Bowel sounds present. No organomegaly or mass.  EXTREMITIES: No pedal edema, cyanosis, or clubbing.  PSYCHIATRIC: The patient is alert SKIN: No obvious rash, lesion, or ulcer.   DATA REVIEW:   CBC  Recent Labs Lab 12/23/16 0521  WBC 12.9*  HGB 13.3  HCT 40.3  PLT 220    Chemistries   Recent Labs Lab 12/19/16 1145  12/21/16 0325  12/24/16 0337  NA 142  < > 148*  < > 153*  K 4.1  < > 3.9  < > 4.0  CL 107  < > 120*  < > 123*  CO2 20*  < > 20*  < > 20*  GLUCOSE 271*  < > 149*  < > 163*  BUN 62*  < > 69*  < > 55*  CREATININE 2.28*  < > 2.02*  < > 1.87*  CALCIUM 9.8  < > 9.5  < > 9.9  MG  --   --  2.6*  --   --   AST 109*  --   --   --   --   ALT 37  --   --   --   --   ALKPHOS 72  --   --   --   --   BILITOT 1.4*  --   --   --   --   <  > = values in this interval not displayed.  Cardiac Enzymes  Recent Labs Lab 12/19/16 1618 12/19/16 2205 12/20/16 0314  TROPONINI 22.24* 45.62* 53.89*    Microbiology Results  @[email or web address] @  RADIOLOGY:  Dg Pelvis 1-2 Views  Result Date: 12/22/2016 CLINICAL DATA:  83 year old with dementia who fell. Initial encounter. EXAM: PELVIS - 1-2 VIEW COMPARISON:  Bone window images from CT abdomen and pelvis 07/11/2015, 02/25/2014. FINDINGS: Prior right bipolar hip arthroplasty with anatomic alignment of the  prosthesis. The visualized hardware is intact. Dystrophic calcification/myositis ossificans adjacent to the right hip. No acute fractures identified involving the pelvis or the proximal left femur. Sacroiliac joints and symphysis pubis intact. Degenerative changes involving the visualized lower lumbar spine. IMPRESSION: 1. No acute osseous abnormality. 2. Right bipolar hip arthroplasty with anatomic alignment. Electronically Signed   By: Hulan Saas M.D.   On: 12/22/2016 10:24      Current Discharge Medication List    START taking these medications   Details  diltiazem (CARDIZEM) 120 MG tablet Take 1 tablet (120 mg total) by mouth every 12 (twelve) hours. Qty: 60 tablet, Refills: 0    metoprolol tartrate (LOPRESSOR) 25 MG tablet Take 1 tablet (25 mg total) by mouth 2 (two) times daily. Qty: 60 tablet, Refills: 0    Morphine Sulfate (MORPHINE CONCENTRATE) 10 MG/0.5ML SOLN concentrated solution Place 0.25 mLs (5 mg total) under the tongue every hour as needed for moderate pain, severe pain or shortness of breath (air hunger). Qty: 30 mL, Refills: 0    nystatin (MYCOSTATIN) 100000 UNIT/ML suspension Use as directed 5 mLs (500,000 Units total) in the mouth or throat 4 (four) times daily. Qty: 60 mL, Refills: 0      CONTINUE these medications which have NOT CHANGED   Details  acetaminophen (TYLENOL) 325 MG tablet Take 650 mg by mouth as needed for mild pain, moderate pain or fever.     levothyroxine (SYNTHROID, LEVOTHROID) 75 MCG tablet Take 75 mcg by mouth daily before breakfast.    polyvinyl alcohol-povidone (HYPOTEARS) 1.4-0.6 % ophthalmic solution Place 1 drop into both eyes 4 (four) times daily.      STOP taking these medications     aspirin EC 81 MG tablet      Calcium 500 MG CHEW      Cholecalciferol (VITAMIN D) 2000 UNITS CAPS      hydrALAZINE (APRESOLINE) 50 MG tablet      Multiple Vitamin (MULTIVITAMIN WITH MINERALS) TABS tablet      amLODipine (NORVASC) 5 MG tablet      oxyCODONE (OXY IR/ROXICODONE) 5 MG immediate release tablet          Management plans discussed with the patient's son and he is in agreement. Stable for discharge   Patient should follow up with hospice  CODE STATUS:     Code Status Orders        Start     Ordered   12/19/16 1528  Do not attempt resuscitation (DNR)  Continuous    Question Answer Comment  In the event of cardiac or respiratory ARREST Do not call a "code blue"   In the event of cardiac or respiratory ARREST Do not perform Intubation, CPR, defibrillation or ACLS   In the event of cardiac or respiratory ARREST Use medication by any route, position, wound care, and other measures to relive pain and suffering. May use oxygen, suction and manual treatment of airway obstruction as needed for comfort.      12/19/16 1527    Code Status History    Date Active Date Inactive Code Status Order ID Comments User Context   12/19/2016  2:13 PM 12/19/2016  3:27 PM DNR [id number]  Enedina Finner, MD ED   05/06/2015 12:48 AM 05/06/2015 10:51 AM Full Code [id number]  Deeann Saint, MD ED   04/11/2015  3:18 PM 04/13/2015  8:06 PM Full Code [id number]  Juanell Fairly, MD Inpatient   04/10/2015  5:43 PM 04/10/2015  5:43 PM Full Code [id number]  Juanell FairlyKrasinski, Kevin, MD Inpatient   04/10/2015  5:28 PM 04/10/2015  5:43 PM Full Code [id number]  Auburn BilberryPatel, Shreyang, MD Inpatient      TOTAL TIME TAKING CARE OF THIS PATIENT: 38 minutes.     Note: This dictation was prepared with Dragon dictation along with smaller phrase technology. Any transcriptional errors that result from this process are unintentional.  Perel Hauschild M.D on 12/24/2016 at 8:20 AM  Between 7am to 6pm - Pager - [phone] After 6pm go to [email or web address] - Social research officer, [email or web address] [email or web address]  [facility]  Office  [phone]  CC: Primary care physician; Danella PentonMiller, Mark F, MD

## 2016-12-23 NOTE — Progress Notes (Signed)
[facility] - [place] at [facility]   PATIENT NAME: Mercedes LentoBetty Powell    MR#:  [id number]  DATE OF BIRTH:  04-May-1928  SUBJECTIVE:   Patient with increased sodium this am  Not eating doing poorly daughter in law at bedside  REVIEW OF SYSTEMS:    Unable to perform dementia  Tolerating Diet: no     DRUG ALLERGIES:   Allergies  Allergen Reactions  . Ace Inhibitors Swelling  . Galantamine Diarrhea  . Maxidex [Dexamethasone] Other (See Comments)    Reaction:  Unknown   . Maxzide [Hydrochlorothiazide W-Triamterene] Other (See Comments)    Reaction:  Hyponatremia   . Procardia [Nifedipine] Swelling  . Zoloft [Sertraline Hcl] Diarrhea    VITALS:  Blood pressure (!) 153/65, pulse 68, temperature 98.3 F (36.8 C), temperature source Oral, resp. rate 18, height 5\' 3"  (1.6 m), weight 49.4 kg (108 lb 14.5 oz), SpO2 96 %.  PHYSICAL EXAMINATION:  Constitutional: Appears Ill appearing patient has dementia mild distress , looks uncomfortable this am HENT: Normocephalic.Marland Kitchen. Oropharynx with thrush Eyes: Conjunctivae  are normal.  no scleral icterus.  Neck: Normal ROM. Neck supple. No JVD. No tracheal deviation. CVS: irr,irr, no murmurs, no gallops, no carotid bruit.  Pulmonary: Effort and breath sounds normal, no stridor, rhonchi, wheezes, rales.  Abdominal: Soft. BS +,  no distension, tenderness, rebound or guarding.  Musculoskeletal:generalized weakness. No edema and no tenderness.  Neuro: Follows commands  Skin: Skin is warm and dry. No rash noted. Psychiatric:dementia     LABORATORY PANEL:   CBC  Recent Labs Lab 12/23/16 0521  WBC 12.9*  HGB 13.3  HCT 40.3  PLT 220   ------------------------------------------------------------------------------------------------------------------  Chemistries   Recent Labs Lab 12/19/16 1145  12/21/16 0325 12/23/16 0521  NA 142  < > 148* 158*  K 4.1  < > 3.9 3.6  CL 107  < > 120* 126*  CO2 20*  < > 20* 22   GLUCOSE 271*  < > 149* 164*  BUN 62*  < > 69* 68*  CREATININE 2.28*  < > 2.02* 1.94*  CALCIUM 9.8  < > 9.5 9.9  MG  --   --  2.6*  --   AST 109*  --   --   --   ALT 37  --   --   --   ALKPHOS 72  --   --   --   BILITOT 1.4*  --   --   --   < > = values in this interval not displayed. ------------------------------------------------------------------------------------------------------------------  Cardiac Enzymes  Recent Labs Lab 12/19/16 1618 12/19/16 2205 12/20/16 0314  TROPONINI 22.24* 45.62* 53.89*   ------------------------------------------------------------------------------------------------------------------  RADIOLOGY:  Dg Pelvis 1-2 Views  Result Date: 12/22/2016 CLINICAL DATA:  83 year old with dementia who fell. Initial encounter. EXAM: PELVIS - 1-2 VIEW COMPARISON:  Bone window images from CT abdomen and pelvis 07/11/2015, 02/25/2014. FINDINGS: Prior right bipolar hip arthroplasty with anatomic alignment of the prosthesis. The visualized hardware is intact. Dystrophic calcification/myositis ossificans adjacent to the right hip. No acute fractures identified involving the pelvis or the proximal left femur. Sacroiliac joints and symphysis pubis intact. Degenerative changes involving the visualized lower lumbar spine. IMPRESSION: 1. No acute osseous abnormality. 2. Right bipolar hip arthroplasty with anatomic alignment. Electronically Signed   By: Hulan Saashomas  Lawrence M.D.   On: 12/22/2016 10:24     ASSESSMENT AND PLAN:   83 year old female with chronic dementia, hypothyroidism who was brought to the emergency room due to  patient being less communicative and not ambulating.  1. Acute metabolic encephalopathy due to myocardial infarction   2. Acute MI: Troponin max 53.89 Patient evaluated by cardiology and due to underlying severe dementia and acute on chronic kidney disease recommendations are for conservative management  Continueaspirin, metoprolol and Dilt as  tolerated   3. Sepsis Has been ruled out with negative Chest x-ray and UA. Blood cultures are negative antibiotics have been discontinued 4. New onset atrial fibrillation with RVR: Heart rate is being controlled with diltiazem and metoprolol. Echocardiogram shows ejection fraction 45-50% without valvular abnormalities no wall motion abnormalities   5. Acute on chronic kidney disease stage III avoid nephrotoxic agents   6. Progressive dementia: Over the course of her hospital stay. Patient had progressive decline in her status. Her sodium level was high due to poor appetite and by mouth intake. After discussion with palliative care and patient's family, recommendations were for hospice. Family is in full agreement  ADDEDENDUM: Plan was for discharge today with hospice but she can not get all equipment at faciilty due to [place].    CODE STATUS: DNR  TOTAL TIME TAKING CARE OF THIS PATIENT: 27 minutes.     POSSIBLE D/C tomorrow   Carol Theys M.D on 12/23/2016 at 5:13 PM  Between 7am to 6pm - Pager - [phone] After 6pm go to [email or web address] - password [email or web address]  [facility]  Office  [phone]  CC: Primary care physician; Danella Penton, MD  Note: This dictation was prepared with Dragon dictation along with smaller phrase technology. Any transcriptional errors that result from this process are unintentional.

## 2016-12-23 NOTE — Care Management Important Message (Signed)
Important Message  Patient Details  Name: Mercedes Powell MRN: [id number] Date of Birth: [birth year]   Medicare Important Message Given:  Yes    Eber HongGreene, Noeh Sparacino R, RN 12/23/2016, 11:54 AM

## 2016-12-23 NOTE — Progress Notes (Signed)
Patient had pause of 2.75 seconds and she was asymptomatic on re-assessment. Dr. Tobi BastosPyreddy notified without any new order. Will continue to monitor.

## 2016-12-23 NOTE — Progress Notes (Signed)
BP this morning is 194/85, rechecked 191/87, Dr. Tobi BastosPyreddy notified with a new order for Hydralazine 10 mg IV every 4 hours for SBP greater than 160. No acute distress noted. Will continue to monitor.

## 2016-12-23 NOTE — Care Management (Signed)
Palliative has spoken with family and wish for patient to return to [facility] with hospice services through [facility].  Patient will require hospital bed and oxygen.  Updated CSW and informed that unable to procure equipment today due to holiday.

## 2016-12-24 LAB — CULTURE, BLOOD (ROUTINE X 2)
CULTURE: NO GROWTH
CULTURE: NO GROWTH
SPECIAL REQUESTS: ADEQUATE

## 2016-12-24 LAB — GLUCOSE, CAPILLARY
GLUCOSE-CAPILLARY: 137 mg/dL — AB (ref 65–99)
Glucose-Capillary: 188 mg/dL — ABNORMAL HIGH (ref 65–99)
Glucose-Capillary: 196 mg/dL — ABNORMAL HIGH (ref 65–99)
Glucose-Capillary: 123 mg/dL — ABNORMAL HIGH (ref 65–99)

## 2016-12-24 LAB — BASIC METABOLIC PANEL
ANION GAP: 10 (ref 5–15)
BUN: 55 mg/dL — ABNORMAL HIGH (ref 6–20)
CALCIUM: 9.9 mg/dL (ref 8.9–10.3)
CO2: 20 mmol/L — AB (ref 22–32)
Chloride: 123 mmol/L — ABNORMAL HIGH (ref 101–111)
Creatinine, Ser: 1.87 mg/dL — ABNORMAL HIGH (ref 0.44–1.00)
GFR calc Af Amer: 27 mL/min — ABNORMAL LOW (ref >60)
GFR calc non Af Amer: 23 mL/min — ABNORMAL LOW (ref >60)
GLUCOSE: 163 mg/dL — AB (ref 65–99)
Potassium: 4 mmol/L (ref 3.5–5.1)
Sodium: 153 mmol/L — ABNORMAL HIGH (ref 135–145)

## 2016-12-24 NOTE — Progress Notes (Signed)
Patient is discharge to [facility] , awaiting pick up by EMS

## 2016-12-24 NOTE — Progress Notes (Signed)
Follow up visit made to new referral for [facility] following discharge. Referral received from CSW Windell MouldingEric Anterhaus on 9/3 by on call referral. Patient seen lying in bed, eyes closed, easily awakened to voice, did not converse with Clinical research associatewriter, but did answer simple questions. Per chart note review patient's appetite remains poor and sodium remains elevated. Oxygen in place at 2 liters, hands cool to touch, warm blanket applied to upper body, patient continued resting and appeared comfortable at end of visit. No family at bedside. Discharge summary faxed to referral. DME requested and is to be delivered this afternoon, plan is for discharge via EMS when equipment is in place. Thank you. Dayna BarkerKaren Robertson RN, BSN, [facility], [facility] Liaison [phone] c

## 2016-12-24 NOTE — Clinical Social Work Note (Signed)
CSW received phone call from [facility] and they said equipment should be delivered to [facility] around 2pm today.  CSW spoke to patient's son [name] who is aware that patient will be discharging today, CS[facility] also [facility] [place] [facility] said [facility] can return today with hospice following her.  [facility] after oxygen and hospital bed has been delivered.  Patient and family agreeable to plans will transport via ems RN to call report [phone].  Windell MouldingEric Caliya Narine, MSW, Theresia MajorsLCSWA [phone]

## 2017-01-13 NOTE — Progress Notes (Signed)
Subjective:  Patient still has altered mental status confused relative lethargic. Lying in bed currently. Significant dementia  Objective:  Vital Signs in the last 24 hours:    Intake/Output from previous day: No intake/output data recorded. Intake/Output from this shift: No intake/output data recorded.  Physical Exam: General appearance: appears stated age Neck: no adenopathy, no carotid bruit, no JVD, supple, symmetrical, trachea midline and thyroid not enlarged, symmetric, no tenderness/mass/nodules Lungs: clear to auscultation bilaterally Heart: regular rate and rhythm, S1, S2 normal, no murmur, click, rub or gallop Abdomen: soft, non-tender; bowel sounds normal; no masses,  no organomegaly Extremities: extremities normal, atraumatic, no cyanosis or edema Pulses: 2+ and symmetric Skin: Skin color, texture, turgor normal. No rashes or lesions Neurologic: Alert and oriented X 3, normal strength and tone. Normal symmetric reflexes. Normal coordination and gait  Lab Results: No results for input(s): WBC, HGB, PLT in the last 72 hours. No results for input(s): NA, K, CL, CO2, GLUCOSE, BUN, CREATININE in the last 72 hours. No results for input(s): TROPONINI in the last 72 hours.  Invalid input(s): CK, MB Hepatic Function Panel No results for input(s): PROT, ALBUMIN, AST, ALT, ALKPHOS, BILITOT, BILIDIR, IBILI in the last 72 hours. No results for input(s): CHOL in the last 72 hours. No results for input(s): PROTIME in the last 72 hours.  Imaging: Imaging results have been reviewed  Cardiac Studies:  Assessment/Plan:  Non-STEMI  Elevated troponin Altered mental status Atrial fibrillation rapid ventricular response Acute on chronic renal insufficiency Sepsis Severe dementia Acute metabolic encephalopathy . Plan Recommend conservative medical therapy Rate control for atrial fibrillation Short-term anticoagulation for A. Fib Do not recommend long-term anticoagulation  because of dementia Broad-spectrum antibiotic therapy Continue follow-up EKGs Recommend conservative medical therapy  LOS: 5 days    Dwayne D Callwood 01/13/2017, 4:54 PM

## 2017-01-20 DEATH — deceased

## 2017-08-10 IMAGING — CR DG HIP (WITH OR WITHOUT PELVIS) 2-3V*R*
3 series · 3 of 3 positions shown · non-contrast
Comparison: None.

CLINICAL DATA: Fall, right hip pain

EXAM:
DG HIP (WITH OR WITHOUT PELVIS) 2-3V RIGHT

[hip ap]
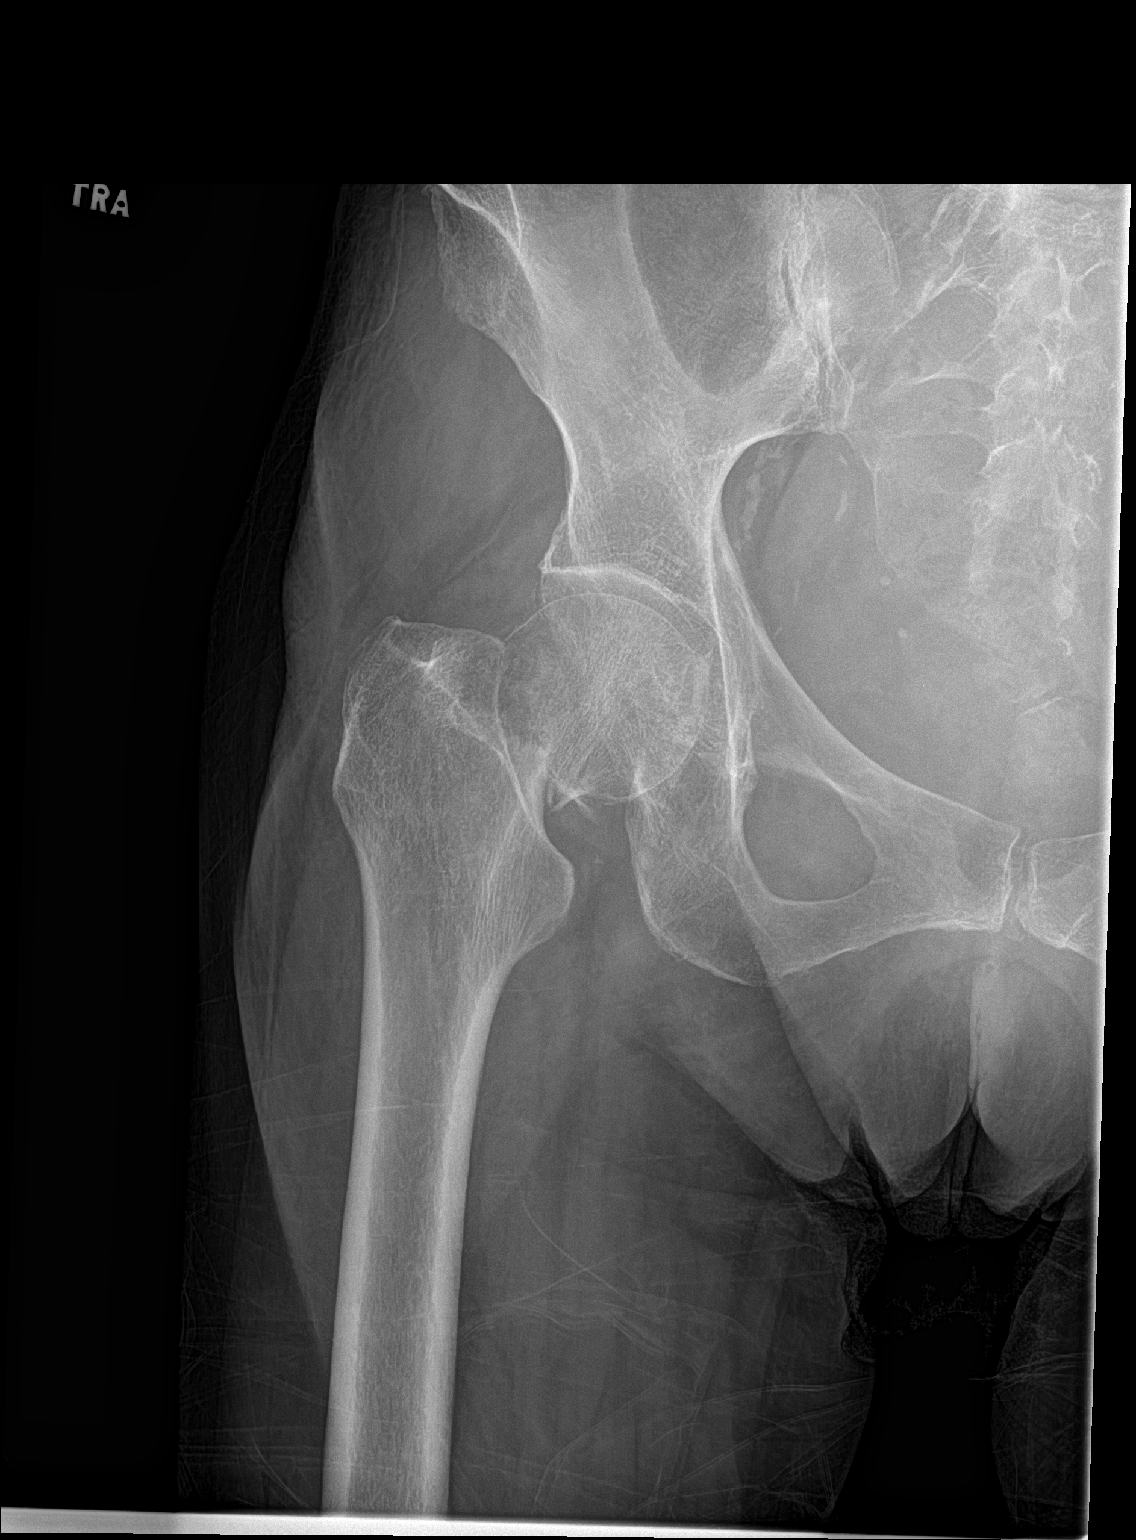

[hip lat]
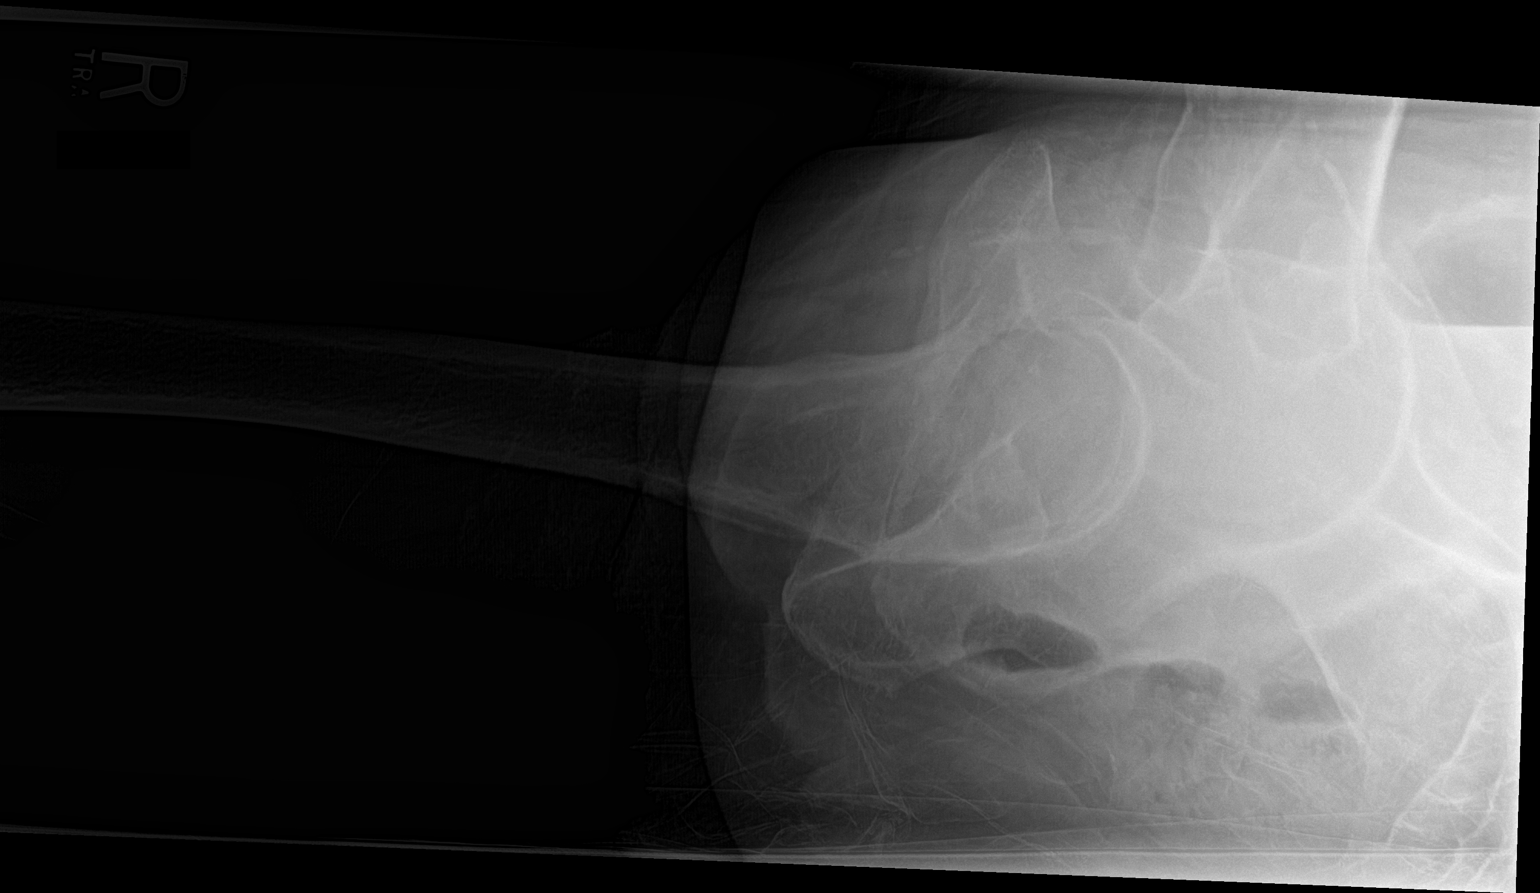

[pelvis ap]
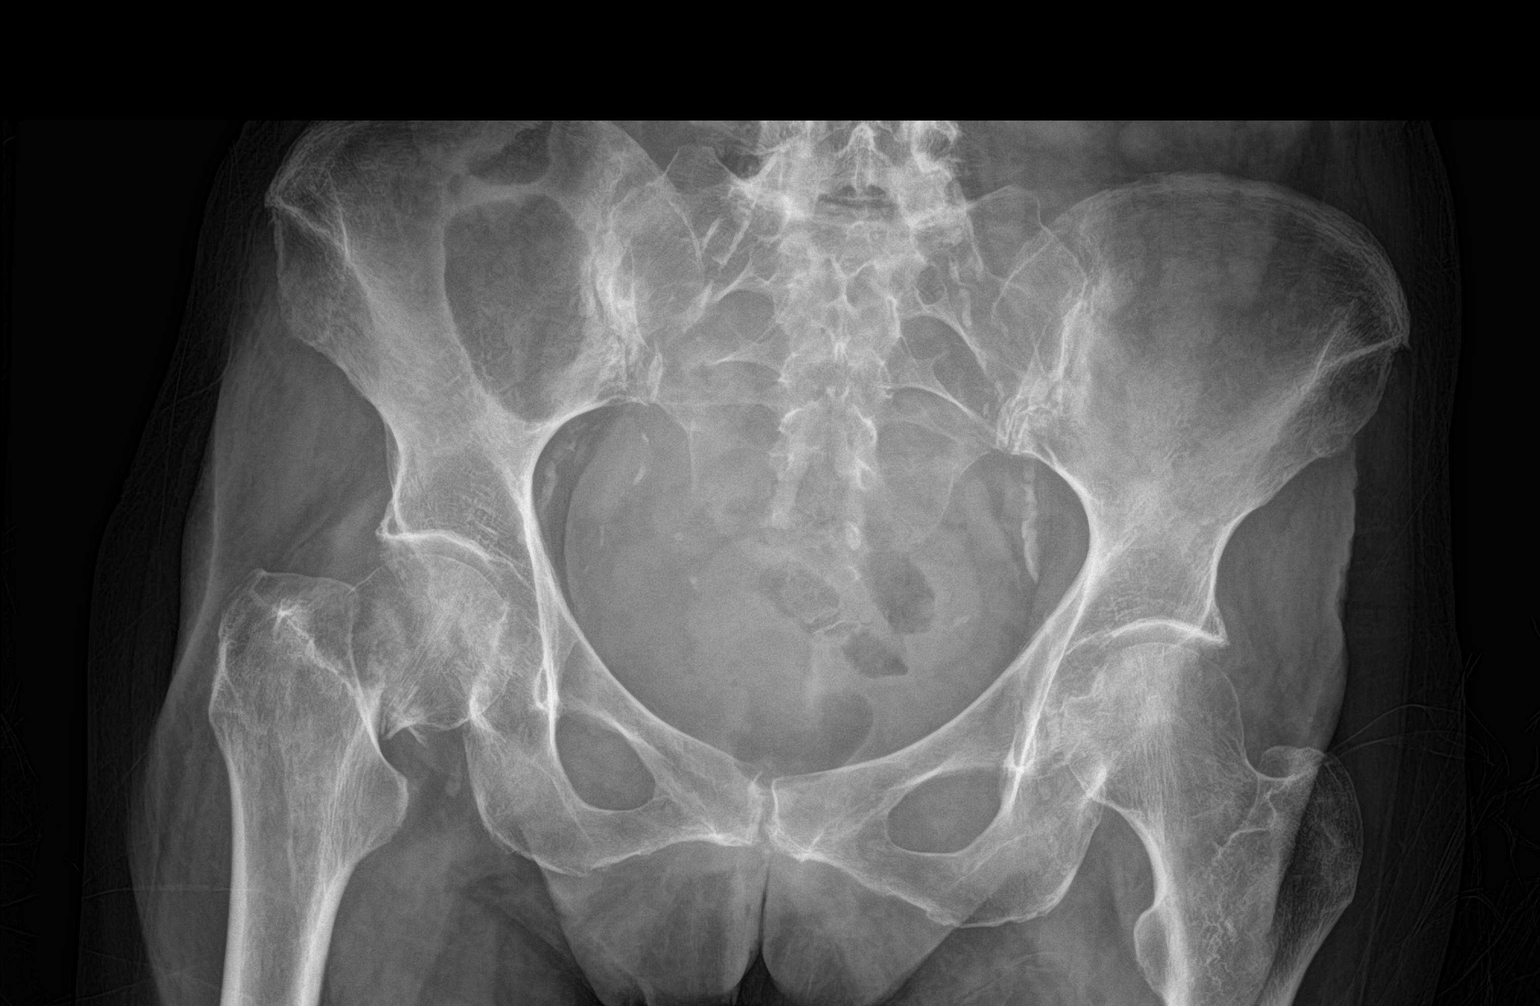

[3 of 3 positions shown; findings below may reference images not displayed]

FINDINGS: Three views of the right hip submitted. Diffuse osteopenia. There is
displaced fracture of the right femoral neck. Degenerative changes
pubic symphysis.
IMPRESSION: Diffuse osteopenia.  Displaced fracture of the right femoral neck.
